# Patient Record
Sex: Female | Born: 1960
Health system: Southern US, Community
[De-identification: ages and names within clinical notes are randomized; demographics above are authoritative.]

## PROBLEM LIST (undated history)

## (undated) DIAGNOSIS — I10 Essential (primary) hypertension: Secondary | ICD-10-CM

## (undated) DIAGNOSIS — I7300 Raynaud's syndrome without gangrene: Secondary | ICD-10-CM

## (undated) DIAGNOSIS — M359 Systemic involvement of connective tissue, unspecified: Secondary | ICD-10-CM

## (undated) HISTORY — PX: CARDIAC ELECTROPHYSIOLOGY MAPPING AND ABLATION: SHX1292

## (undated) HISTORY — DX: Raynaud's syndrome without gangrene: I73.00

## (undated) HISTORY — DX: Essential (primary) hypertension: I10

## (undated) HISTORY — DX: Systemic involvement of connective tissue, unspecified: M35.9

---

## 2005-06-12 HISTORY — PX: HERNIA REPAIR: SHX51

## 2005-06-23 ENCOUNTER — Ambulatory Visit: Payer: Self-pay | Admitting: Family Medicine

## 2005-07-14 ENCOUNTER — Ambulatory Visit: Payer: Self-pay | Admitting: Family Medicine

## 2007-12-24 ENCOUNTER — Encounter: Payer: Self-pay | Admitting: Family Medicine

## 2008-12-31 ENCOUNTER — Encounter: Payer: Self-pay | Admitting: Family Medicine

## 2009-03-24 ENCOUNTER — Ambulatory Visit: Payer: Self-pay | Admitting: Diagnostic Radiology

## 2009-03-24 ENCOUNTER — Emergency Department (HOSPITAL_BASED_OUTPATIENT_CLINIC_OR_DEPARTMENT_OTHER): Admission: EM | Admit: 2009-03-24 | Discharge: 2009-03-24 | Payer: Self-pay | Admitting: Emergency Medicine

## 2009-03-24 ENCOUNTER — Ambulatory Visit: Payer: Self-pay | Admitting: Family Medicine

## 2009-05-12 ENCOUNTER — Encounter: Payer: Self-pay | Admitting: Family Medicine

## 2010-02-04 ENCOUNTER — Encounter: Payer: Self-pay | Admitting: Family Medicine

## 2010-07-12 NOTE — Letter (Signed)
Summary: [facility]  [facility]   Imported By: Lanelle Bal 07/01/2009 11:48:50  _____________________________________________________________________  External Attachment:    Type:   Image     Comment:   External Document

## 2010-07-12 NOTE — Letter (Signed)
Summary: [facility]   Imported By: Lanelle Bal 02/17/2010 08:28:59  _____________________________________________________________________  External Attachment:    Type:   Image     Comment:   External Document

## 2010-09-15 LAB — CBC
MCV: 91.6 fL (ref 78.0–100.0)
RBC: 4.44 MIL/uL (ref 3.87–5.11)

## 2010-09-15 LAB — POCT CARDIAC MARKERS
CKMB, poc: <1 ng/mL — ABNORMAL LOW (ref 1.0–8.0)
Myoglobin, poc: 78.6 ng/mL (ref 12–200)
Troponin i, poc: <0.05 ng/mL (ref 0.00–0.09)

## 2010-09-15 LAB — DIFFERENTIAL
Basophils Absolute: 0.1 10*3/uL (ref 0.0–0.1)
Basophils Relative: 2 % — ABNORMAL HIGH (ref 0–1)
Eosinophils Absolute: 0 10*3/uL (ref 0.0–0.7)
Neutrophils Relative %: 89 % — ABNORMAL HIGH (ref 43–77)

## 2010-09-15 LAB — BASIC METABOLIC PANEL
BUN: 11 mg/dL (ref 6–23)
CO2: 21 mEq/L (ref 19–32)
Calcium: 9.6 mg/dL (ref 8.4–10.5)
Chloride: 102 mEq/L (ref 96–112)
GFR calc Af Amer: 59 mL/min — ABNORMAL LOW (ref >60)
GFR calc non Af Amer: 48 mL/min — ABNORMAL LOW (ref >60)
Glucose, Bld: 166 mg/dL — ABNORMAL HIGH (ref 70–99)
Potassium: 4.4 mEq/L (ref 3.5–5.1)
Sodium: 140 mEq/L (ref 135–145)

## 2010-12-04 ENCOUNTER — Encounter: Payer: Self-pay | Admitting: Family Medicine

## 2010-12-09 ENCOUNTER — Other Ambulatory Visit (HOSPITAL_COMMUNITY)
Admission: RE | Admit: 2010-12-09 | Discharge: 2010-12-09 | Disposition: A | Discharge status: Home / Self Care | Payer: BLUE CROSS/BLUE SHIELD | Source: Ambulatory Visit | Attending: Family Medicine | Admitting: Family Medicine

## 2010-12-09 ENCOUNTER — Encounter: Payer: Self-pay | Admitting: Family Medicine

## 2010-12-09 ENCOUNTER — Ambulatory Visit (INDEPENDENT_AMBULATORY_CARE_PROVIDER_SITE_OTHER): Payer: BLUE CROSS/BLUE SHIELD | Admitting: Family Medicine

## 2010-12-09 DIAGNOSIS — Z01419 Encounter for gynecological examination (general) (routine) without abnormal findings: Secondary | ICD-10-CM | POA: Insufficient documentation

## 2010-12-09 DIAGNOSIS — Z13220 Encounter for screening for lipoid disorders: Secondary | ICD-10-CM

## 2010-12-09 DIAGNOSIS — Z1231 Encounter for screening mammogram for malignant neoplasm of breast: Secondary | ICD-10-CM

## 2010-12-09 DIAGNOSIS — Z1329 Encounter for screening for other suspected endocrine disorder: Secondary | ICD-10-CM

## 2010-12-09 DIAGNOSIS — Z130 Encounter for screening for diseases of the blood and blood-forming organs and certain disorders involving the immune mechanism: Secondary | ICD-10-CM

## 2010-12-09 MED ORDER — NORELGESTROMIN-ETH ESTRADIOL 150-35 MCG/24HR TD PTWK: 1.0000 | MEDICATED_PATCH | TRANSDERMAL | Status: DC

## 2010-12-09 NOTE — Progress Notes (Signed)
  Subjective:    Patient ID: Mariah Mckinney, female    DOB: 10-27-1960, 50 y.o.   MRN: [id number]  HPI  50 yo AAF presents for CPE.  She is having regular periods on the ortho evra.  She is seeing Dr Huel Coventry at [facility] for her connective tissue dz and alopecia, doing well on plaquenil.  She sees optho on a regular basis.  She is married, monagamous, due for fasting labs.  She had Tdap updated in 2005.  Denies fam hx of colon or breast cancer.  Mammogram is due.  Pap is due.  Denies fam hx of premature heart dz.  BP 126/83  Pulse 80  Temp(Src) 98.3 F (36.8 C) (Oral)  Ht 5' 6.5" (1.689 m)  Wt 137 lb (62.143 kg)  BMI 21.78 kg/m2  SpO2 100%  LMP 11/28/2010   Review of SystemsGen: no fevers, chills, hot flashes, night sweats, change in weight GI: no N/V/C/D GU: no dysuria, incontinence or sexual dysfunction CV: no chest pain, DOE, palpitations s or edema Pulm:  Denies CP, SOB or chronic cough      Objective:   Physical ExamGen: alert, well groomed in NAD Neck: no thyromegaly or cervical lymphadenopathy CV: RRR w/o murmur, no audible carotid bruits or abdominal aortic bruits Ext: no edema, clubbing or cyanosis Lungs: CTA bilat w/o W/R/R; nonlabored HEENT:  [place]/AT; PERRLA; oropharynx pink and moist with good dentition Abd: soft, NT, ND, NABS, No HSM, no audible AA bruits Skin: warm and dry; no rash, pallor or jaundice Psych: does not appear anxious or depressed; answers questions appropriately Breasts: no palpable masses, nipple discharge or axillary lymphadenopathy GU normal external exam.  nromal appearing cervical os.  Thin prep pap obtained.  Normal bimanual exam.        Assessment & Plan:  Assesment:  1. CPE- Keeping healthy checklist for women reviewed today.  BP at goal.  BMI 21  in the normal range.     Labs ordered Colonoscopy due at 52 Contraception-- RFd Ortho Evra but will ask Dr Vassie Moselle if she's been tested for APA syndrome which would put her higher risk of blood  clots with her connective tissue d/o. Last pap- updated today Mammogram- scheduled. Immunizaitons UTD. Encouraged healthy diet, regular exercise, MVI daily. Return for next physical in 1 yr.

## 2010-12-09 NOTE — Patient Instructions (Signed)
Ortho Evra RFd.  Update fasting labs one morning. Will call you with lab and pap results.  Call [phone] and ask for [place] to set up your mammogram downstairs.  Return as needed or in 1 yr for next physical.

## 2010-12-14 ENCOUNTER — Telehealth: Payer: Self-pay | Admitting: Family Medicine

## 2010-12-14 NOTE — Telephone Encounter (Signed)
Pls let pt know that her pap smear came back normal.  Repeat in 2 yrs. 

## 2010-12-15 NOTE — Telephone Encounter (Signed)
LMOM informing Pt  

## 2010-12-20 ENCOUNTER — Ambulatory Visit
Admission: RE | Admit: 2010-12-20 | Discharge: 2010-12-20 | Disposition: A | Discharge status: Home / Self Care | Payer: BLUE CROSS/BLUE SHIELD | Source: Ambulatory Visit | Attending: Family Medicine | Admitting: Family Medicine

## 2010-12-20 DIAGNOSIS — Z1231 Encounter for screening mammogram for malignant neoplasm of breast: Secondary | ICD-10-CM

## 2010-12-20 LAB — LIPID PANEL
Cholesterol: 175 mg/dL (ref 0–200)
HDL: 61 mg/dL (ref >39)
LDL Cholesterol: 100 mg/dL — ABNORMAL HIGH (ref 0–99)
Total CHOL/HDL Ratio: 2.9 Ratio

## 2010-12-21 ENCOUNTER — Telehealth: Payer: Self-pay | Admitting: Family Medicine

## 2010-12-21 LAB — COMPLETE METABOLIC PANEL WITH GFR
ALT: 10 U/L (ref 0–35)
Albumin: 3.9 g/dL (ref 3.5–5.2)
Alkaline Phosphatase: 58 U/L (ref 39–117)
Calcium: 9.4 mg/dL (ref 8.4–10.5)
Chloride: 101 mEq/L (ref 96–112)
GFR, Est African American: >60 mL/min (ref >60)
Glucose, Bld: 73 mg/dL (ref 70–99)
Total Bilirubin: 0.5 mg/dL (ref 0.3–1.2)

## 2010-12-21 NOTE — Telephone Encounter (Signed)
Please call pt.  Chemistry panel shows a normal fasting sugar, liver and kidney function with excellent cholesterol results.  Repeat in 1-2 yrs.    

## 2010-12-21 NOTE — Telephone Encounter (Signed)
Pt aware of the above  

## 2011-01-31 ENCOUNTER — Ambulatory Visit (INDEPENDENT_AMBULATORY_CARE_PROVIDER_SITE_OTHER): Payer: BLUE CROSS/BLUE SHIELD | Admitting: Family Medicine

## 2011-01-31 ENCOUNTER — Encounter: Payer: Self-pay | Admitting: Family Medicine

## 2011-01-31 DIAGNOSIS — R42 Dizziness and giddiness: Secondary | ICD-10-CM

## 2011-01-31 DIAGNOSIS — R079 Chest pain, unspecified: Secondary | ICD-10-CM

## 2011-01-31 LAB — COMPLETE METABOLIC PANEL WITH GFR
ALT: 16 U/L (ref 0–35)
Albumin: 4.7 g/dL (ref 3.5–5.2)
Alkaline Phosphatase: 70 U/L (ref 39–117)
CO2: 24 mEq/L (ref 19–32)
Creat: 0.9 mg/dL (ref 0.50–1.10)
GFR, Est African American: >60 mL/min (ref >60)
GFR, Est Non African American: >60 mL/min (ref >60)
Total Bilirubin: 0.5 mg/dL (ref 0.3–1.2)
Total Protein: 8 g/dL (ref 6.0–8.3)

## 2011-01-31 LAB — POCT URINALYSIS DIPSTICK
Bilirubin, UA: NEGATIVE
Glucose, UA: NEGATIVE
Nitrite, UA: NEGATIVE
Protein, UA: NEGATIVE
pH, UA: 6

## 2011-01-31 LAB — CBC WITH DIFFERENTIAL/PLATELET
Basophils Relative: 1 % (ref 0–1)
Lymphocytes Relative: 28 % (ref 12–46)
Lymphs Abs: 0.8 10*3/uL (ref 0.7–4.0)
MCH: 29.4 pg (ref 26.0–34.0)
MCHC: 33.9 g/dL (ref 30.0–36.0)
Monocytes Absolute: 0.2 10*3/uL (ref 0.1–1.0)
Monocytes Relative: 8 % (ref 3–12)
Neutrophils Relative %: 64 % (ref 43–77)
Platelets: 218 10*3/uL (ref 150–400)
RDW: 13 % (ref 11.5–15.5)
WBC: 3.1 10*3/uL — ABNORMAL LOW (ref 4.0–10.5)

## 2011-01-31 LAB — IRON AND TIBC
%SAT: 32 % (ref 20–55)
UIBC: 241 ug/dL

## 2011-01-31 LAB — CK TOTAL AND CKMB (NOT AT ARMC): CK, MB: 0.5 ng/mL (ref 0.3–4.0)

## 2011-01-31 NOTE — Patient Instructions (Signed)
Labs today.  Will call you w/ results by tomorrow.  Take it easy tonight.  If any chest pain, shortness of breathe, worsening symptoms, please go to the emergency room.

## 2011-01-31 NOTE — Progress Notes (Signed)
  Subjective:    Patient ID: Mariah Mckinney, female    DOB: Jan 24, 1961, 50 y.o.   MRN: [id number]  HPI  50 yo AAF with hx of connective tissue d/o presents for about 2 wks of varying symptoms.  She had a nosebleed 2 wks ago followed by feeling lightheadedness, HAs.  She had her period last wk and it was not heavy.  She has had numbness in the L shoulder and L arm on and off for about 2 wks.  She saw her rheumatologist and had an injection in her L shoulder but it only helped for a few days.  She feels hot all over but is not sweaty.  She had had a few waves of nausea and fatigue.  She has some episodes of rapid heartbeat with SOB.  No acute stressors.  No chest pain but she's had some chest tightness.    Has not had any syncope or presyncope.  Eating and drinking normally.  She does not have any known fam hx of heart dz.  She's had some heart dz in her 2nd cousins at a young age.    She sees Dr Isabell Jarvis for cardiology care but it has been about a year.  Her only cardiac hx is significant for an ablation.  She is not a smoker.  She has HTN but no hx of high cholesterol.  BP 150/90  Pulse 71  Temp(Src) 98.3 F (36.8 C) (Oral)  Ht 5' 6.5" (1.689 m)  Wt 137 lb (62.143 kg)  BMI 21.78 kg/m2  SpO2 97%  LMP 01/24/2011    Review of Systems  Constitutional: Positive for fatigue. Negative for fever and unexpected weight change.  Respiratory: Positive for chest tightness and shortness of breath.   Cardiovascular: Positive for chest pain and palpitations. Negative for leg swelling.  Gastrointestinal: Positive for nausea. Negative for abdominal pain and diarrhea.  Neurological: Positive for weakness and light-headedness. Negative for dizziness.  Psychiatric/Behavioral: Negative for dysphoric mood.       Objective:   Physical Exam  Constitutional: She appears well-developed and well-nourished.  HENT:  Mouth/Throat: Oropharynx is clear and moist.  Neck: Neck supple. No thyromegaly present.    Cardiovascular: Normal rate, regular rhythm and normal heart sounds.  Exam reveals no gallop and no friction rub.   No murmur heard. Pulmonary/Chest: Effort normal and breath sounds normal. No respiratory distress. She has no wheezes.       No splinting or reproducible chest wall tenderness to palpation  Abdominal: Soft. Bowel sounds are normal. There is no tenderness.  Musculoskeletal: She exhibits no edema.  Lymphadenopathy:    She has no cervical adenopathy.  Neurological:       No tremor.  Gait normal  Skin: Skin is warm and dry.  Psychiatric: She has a normal mood and affect.          Assessment & Plan:  Chest pain with lightheadness, nausea and sweating x 2 wks   EKG today shows NSR at 67 bpm with PR of  162 msec, QTc of 405 msec, normal axis.  TWI in leads AVF and II, III, V3, V1. She is hemodynamically stable and I do not have an old one to compare to.  Faxed today's to Dr Isabell Jarvis to review. Will get cardiac enzymes, CBC, D dimer, etc today and f/u results tonight.  If worsening of symptoms, go to the ED immediately.  Pt understands plan.

## 2011-02-01 ENCOUNTER — Telehealth: Payer: Self-pay | Admitting: Family Medicine

## 2011-02-01 NOTE — Telephone Encounter (Signed)
Pt's husband called and said he had just gotten a call from Payton Spark, CMA about his wife, but he wanted to let us know that she is at the ED at [facility] getting checked out because she was having CP and SOB this am. Plan:  Louis Meckel, CMA and she said she was trying to get a hold of the pt because they were going to send to the lab for a D-Dimer test to r/o clot.  Told the pt so he could suggest to the ED to make sure they run that test while they are working up the pt. Jarvis Newcomer, LPN [facility]

## 2011-02-02 ENCOUNTER — Telehealth: Payer: Self-pay | Admitting: Family Medicine

## 2011-02-02 NOTE — Telephone Encounter (Signed)
I called and spoke to [place].  Her w/u from the ED was 'normal'.  A CT chest r/o PE and her cardiac testing was neg.  She has f/u with her cardiologist, Dr Isabell Jarvis  In 2 wks and has called her rheumatologist for f/u.  Her labs here was normal other than a WBC of 3.1.  She is still having some chest pains.  I suggest she start Prilosec OTC daily b/c her NSAID may be causing some GERD symptoms.  She agrees and will f/u here after she sees her specialist. Marcelino Duster, pls call [facility] ED to get copy of recent notes.  She does not need to sign a release for this.

## 2011-02-02 NOTE — Telephone Encounter (Signed)
Acknowledged. Timur Nibert, LPN /Triage  

## 2011-02-02 NOTE — Telephone Encounter (Signed)
Pt called to see why we were calling her yesterday.  Told her we spoke with her husband yesterday and told him since she was in ED to have them runa D-Dimer since that is why we were intially calling her.  Pt said the labs checked out to be negative. Plan:  Pt told to schedule an 30 min hospital follow up appt with our office, and sign medical release of records, and to have them fax to our office.  CLinical fax number supplied to the pt.  Pt would like all her lab results from 01-31-11.  I tried to pull the lab results but the labs are not pulling up from my end.  Please advise. Routed to Dr. Marlyne Beards, LPN [facility]

## 2011-02-07 ENCOUNTER — Ambulatory Visit (INDEPENDENT_AMBULATORY_CARE_PROVIDER_SITE_OTHER): Payer: BLUE CROSS/BLUE SHIELD | Admitting: Family Medicine

## 2011-02-07 ENCOUNTER — Encounter: Payer: Self-pay | Admitting: Family Medicine

## 2011-02-07 DIAGNOSIS — R002 Palpitations: Secondary | ICD-10-CM

## 2011-02-07 NOTE — Progress Notes (Signed)
Subjective:    Patient ID: Mariah Mckinney, female    DOB: 18-Jul-1960, 50 y.o.   MRN: [id number]  HPI Chest Pain f/u - Saw Dr. Cathey Endow on 8/21.  The next AM went to the ED.  Rheum says not related to the connective tissues d/o.  Cards feels like it is not her heart. Chest feels like a rapid heart beat. Gets SOB with it. Lasts seconds.  The longest it lasts is a couple of minutes. Usually every day. She feels her heart rate is fast. She feels like it is her SVT.  Takes her metoprolol every day.  Feel lightheaded a lot. Thyroid level was normal at the hosp. D-dimer was normaml at the hosp. Palpitations Started about 1 month ago. Sometimes feels warm all over. After she was discharged from the emergency department she was told to followup with her PCP as well as her cardiologist. She has also been having some pain in her left shoulder which now is radiating up into her left ear and would like me to take a look in her ear. She did recently have an injection in her left shoulder by her rheumatologist.She did get some pain relief initially. She denies nay caffeine intake.   Review of Systems     Objective:   Physical Exam  Constitutional: She is oriented to person, place, and time. She appears well-developed and well-nourished.  HENT:  Head: Normocephalic and atraumatic.  Right Ear: External ear normal.  Left Ear: External ear normal.  Nose: Nose normal.       TMs and canals are clear.   Eyes: Conjunctivae and EOM are normal. Pupils are equal, round, and reactive to light.  Neck: Neck supple. No thyromegaly present.  Cardiovascular: Normal rate, regular rhythm and normal heart sounds.   Pulmonary/Chest: Effort normal and breath sounds normal. She has no wheezes.  Musculoskeletal: She exhibits no edema.  Lymphadenopathy:    She has no cervical adenopathy.  Neurological: She is alert and oriented to person, place, and time.  Skin: Skin is warm and dry.  Psychiatric: She has a normal mood and  affect. Her behavior is normal.          Assessment & Plan:  Palpitations-I discussed with her and her husband that I think this is most likely related to her SVT.  She says herself that she feels that the symptoms are very similar to her SVT just not as intense as what she had before. She did have an ablation in the past which was considered technically unsuccessful. Certainly we could increase her metoprolol to see if that gets her symptoms under better control but she would like to wait as she sees her cardiologist next week. I explained to her that she's had most other things for now. If not a thyroid disorder as her thyroid was normal at the hospital. Her d-dimer was normal so this rules out a pulmonary and malaise. She was ruled out for an acute cardiac event. She had a chest x-ray which per her report was normal which rules out infection. She also had a normal complete blood count was also rules out infection as well as anemia which can cause palpitations. This would be unlikely to be her hormone therapy but we could consider this. Today her blood pressure looks fantastic. She denies feeling stressed or anxious. I did mention that anxiety can be a cause for palpitations and we have ruled out other causes. She also says that her rheumatologist feels that  this is not related to her connective tissue disorder. I explained to her that at this point I think her best that is keeping her appointment with her cardiologist for next week to have them evaluated this could be her SVT again. If the cardiologist feels this is completely unrelated and we may need to consider a diagnosis of anxiety. I don't recommend any additional blood work at this point in time. She denies caffeine or stimulatn intake.

## 2011-02-15 ENCOUNTER — Encounter: Payer: Self-pay | Admitting: Family Medicine

## 2011-11-22 ENCOUNTER — Telehealth: Payer: Self-pay | Admitting: Family Medicine

## 2011-11-22 MED ORDER — NORELGESTROMIN-ETH ESTRADIOL 150-35 MCG/24HR TD PTWK: 1.0000 | MEDICATED_PATCH | TRANSDERMAL | Status: DC

## 2011-11-22 NOTE — Telephone Encounter (Signed)
Patient walk-in made a cpe appt..She use to be a patient of Dr. Misty Stanley.Marland KitchenMarland KitchenMade an appointment for 12/13/11 but is out of Ortho Ever birth control and wouldl ike to have enough sent to [facility] on [address] until her appt on 12/13/11.Marland KitchenMarland Kitchen

## 2011-12-08 ENCOUNTER — Encounter: Payer: Self-pay | Admitting: *Deleted

## 2011-12-13 ENCOUNTER — Encounter: Payer: Self-pay | Admitting: Family Medicine

## 2011-12-13 ENCOUNTER — Ambulatory Visit (INDEPENDENT_AMBULATORY_CARE_PROVIDER_SITE_OTHER): Payer: BC Managed Care – PPO | Admitting: Family Medicine

## 2011-12-13 ENCOUNTER — Other Ambulatory Visit (HOSPITAL_COMMUNITY)
Admission: RE | Admit: 2011-12-13 | Discharge: 2011-12-13 | Disposition: A | Discharge status: Home / Self Care | Payer: BC Managed Care – PPO | Source: Ambulatory Visit | Attending: Family Medicine | Admitting: Family Medicine

## 2011-12-13 VITALS — BP 107/75 | HR 80 | Ht 66.5 in | Wt 131.0 lb

## 2011-12-13 DIAGNOSIS — Z01419 Encounter for gynecological examination (general) (routine) without abnormal findings: Secondary | ICD-10-CM

## 2011-12-13 DIAGNOSIS — N760 Acute vaginitis: Secondary | ICD-10-CM

## 2011-12-13 DIAGNOSIS — Z23 Encounter for immunization: Secondary | ICD-10-CM

## 2011-12-13 DIAGNOSIS — R002 Palpitations: Secondary | ICD-10-CM

## 2011-12-13 DIAGNOSIS — M359 Systemic involvement of connective tissue, unspecified: Secondary | ICD-10-CM

## 2011-12-13 DIAGNOSIS — I471 Supraventricular tachycardia: Secondary | ICD-10-CM

## 2011-12-13 MED ORDER — NORELGESTROMIN-ETH ESTRADIOL 150-35 MCG/24HR TD PTWK: 1.0000 | MEDICATED_PATCH | TRANSDERMAL | Status: DC

## 2011-12-13 NOTE — Progress Notes (Signed)
  Subjective:     Mariah Mckinney is a 51 y.o. female and is here for a comprehensive physical exam. The patient reports no problems. She has noticed a slight increase in discharge. No discomfort or itching or bleeding. No pelvic pain or fever.  History   Social History  . Marital Status: Married    Spouse Name: N/A    Number of Children: 1   . Years of Education: N/A   Occupational History  . Stay at home mom    Social History Main Topics  . Smoking status: Never Smoker   . Smokeless tobacco: Not on file  . Alcohol Use: No  . Drug Use: No  . Sexually Active: Yes -- Female partner(s)     married, 62 yr old son, walks daily.   Other Topics Concern  . Not on file   Social History Narrative   Walking for exercise.    Health Maintenance  Topic Date Due  . Tetanus/tdap  06/26/1981  . Influenza Vaccine  03/12/2012  . Pap Smear  12/13/2014    The following portions of the patient's history were reviewed and updated as appropriate: allergies, current medications, past family history, past medical history, past social history, past surgical history and problem list.  Review of Systems A comprehensive review of systems was negative.   Objective:    BP 107/75  Pulse 80  Ht 5' 6.5" (1.689 m)  Wt 131 lb (59.421 kg)  BMI 20.83 kg/m2  LMP 11/27/2011 General appearance: alert, cooperative and appears stated age Head: Normocephalic, without obvious abnormality, atraumatic Eyes: conj clear, EOMi, PEERLA Ears: normal TM's and external ear canals both ears Nose: Nares normal. Septum midline. Mucosa normal. No drainage or sinus tenderness. Throat: lips, mucosa, and tongue normal; teeth and gums normal Neck: no adenopathy, no carotid bruit, no JVD, supple, symmetrical, trachea midline and thyroid not enlarged, symmetric, no tenderness/mass/nodules Back: symmetric, no curvature. ROM normal. No CVA tenderness. Lungs: clear to auscultation bilaterally Breasts: normal appearance, no masses  or tenderness Heart: regular rate and rhythm, S1, S2 normal, no murmur, click, rub or gallop Abdomen: soft, non-tender; bowel sounds normal; no masses,  no organomegaly Pelvic: cervix normal in appearance, external genitalia normal, no adnexal masses or tenderness, no cervical motion tenderness, rectovaginal septum normal, uterus normal size, shape, and consistency and vagina normal without discharge Extremities: extremities normal, atraumatic, no cyanosis or edema Pulses: 2+ and symmetric Skin: Skin color, texture, turgor normal. No rashes or lesions Lymph nodes: Cervical, supraclavicular, and axillary nodes normal. Neurologic: Grossly normal    Assessment:    Healthy female exam.     Plan:     See After Visit Summary for Counseling Recommendations  Start a regular exercise program and make sure you are eating a healthy diet Try to eat 4 servings of dairy a day or take a calcium supplement (500mg  twice a day). Your vaccines are up to date.  Screening labs.   Tdap given today.   Refill Ortho Evra for one year. If her Pap smear comes back normal repeat in 2-3 years. She's not had a prior abnormal Pap smear.  Increased vaginal discharge-will check a wet prep. On exam it appears to be physiologic.

## 2011-12-13 NOTE — Patient Instructions (Addendum)
Start a regular exercise program and make sure you are eating a healthy diet Try to eat 4 servings of dairy a day  Your vaccines are up to date.   

## 2011-12-14 LAB — WET PREP FOR TRICH, YEAST, CLUE
Trich, Wet Prep: NONE SEEN
WBC, Wet Prep HPF POC: NONE SEEN

## 2011-12-22 ENCOUNTER — Encounter: Payer: Self-pay | Admitting: *Deleted

## 2011-12-28 LAB — COMPLETE METABOLIC PANEL WITH GFR
Albumin: 3.9 g/dL (ref 3.5–5.2)
BUN: 8 mg/dL (ref 6–23)
Calcium: 9.2 mg/dL (ref 8.4–10.5)
Creat: 0.92 mg/dL (ref 0.50–1.10)
Glucose, Bld: 72 mg/dL (ref 70–99)
Potassium: 4.2 mEq/L (ref 3.5–5.3)
Total Protein: 7 g/dL (ref 6.0–8.3)

## 2011-12-28 LAB — LIPID PANEL
Cholesterol: 200 mg/dL (ref 0–200)
HDL: 66 mg/dL (ref >39)
Total CHOL/HDL Ratio: 3 Ratio

## 2012-01-10 DIAGNOSIS — M359 Systemic involvement of connective tissue, unspecified: Secondary | ICD-10-CM | POA: Insufficient documentation

## 2012-04-17 ENCOUNTER — Encounter: Payer: Self-pay | Admitting: Family Medicine

## 2012-04-17 ENCOUNTER — Ambulatory Visit (INDEPENDENT_AMBULATORY_CARE_PROVIDER_SITE_OTHER): Payer: BC Managed Care – PPO | Admitting: Family Medicine

## 2012-04-17 VITALS — BP 124/75 | HR 75 | Ht 66.5 in | Wt 135.0 lb

## 2012-04-17 DIAGNOSIS — N912 Amenorrhea, unspecified: Secondary | ICD-10-CM

## 2012-04-17 DIAGNOSIS — R5383 Other fatigue: Secondary | ICD-10-CM

## 2012-04-17 DIAGNOSIS — R5381 Other malaise: Secondary | ICD-10-CM

## 2012-04-17 NOTE — Progress Notes (Signed)
  Subjective:    Patient ID: Mariah Mckinney, female    DOB: 1961-02-22, 51 y.o.   MRN: [id number]  HPI Period were very irregular up until July and then has not had a period for 4 months.  No breast tenderness. No hotflashes.  Usng the patch for 10 years for birth control. Doesn't have the patch on today. No skin or hair changes thought derm noticed  Some inc hair thinning. No other sxs. NO GI sxs.  Had normal pap smear in July.    Review of Systems     Objective:   Physical Exam  Constitutional: She is oriented to person, place, and time. She appears well-developed and well-nourished.  HENT:  Head: Normocephalic and atraumatic.  Neck: Neck supple. No thyromegaly present.  Cardiovascular: Normal rate, regular rhythm and normal heart sounds.        No thyroid nodule. No carotid bruits.   Pulmonary/Chest: Effort normal and breath sounds normal.  Abdominal: Soft. Bowel sounds are normal. She exhibits no distension and no mass. There is no tenderness. There is no rebound and no guarding.  Musculoskeletal: She exhibits no edema.  Lymphadenopathy:    She has no cervical adenopathy.  Neurological: She is alert and oriented to person, place, and time.  Skin: Skin is warm and dry.  Psychiatric: She has a normal mood and affect. Her behavior is normal.          Assessment & Plan:  Amenorrhea - certainly she could be entering menopause. We will check estradiol, progesterone, FSH, LH. We will also check thyroid to rule out thyroid abnormality. Also check a CBC. She's currently on birth control but she is sexually active so we will also check for a serum pregnancy. We'll call with results as soon as they're back. She is not currently expensing any postmenopausal symptoms.

## 2012-04-17 NOTE — Patient Instructions (Addendum)
We will call you with your lab results. If you don't here from us in about a week then please give us a call at [phone].  

## 2012-04-18 LAB — CBC WITH DIFFERENTIAL/PLATELET
Basophils Absolute: 0 10*3/uL (ref 0.0–0.1)
Basophils Relative: 1 % (ref 0–1)
Eosinophils Relative: 0 % (ref 0–5)
Hemoglobin: 12.5 g/dL (ref 12.0–15.0)
Lymphs Abs: 0.7 10*3/uL (ref 0.7–4.0)
MCH: 29.1 pg (ref 26.0–34.0)
MCV: 86.7 fL (ref 78.0–100.0)
Monocytes Relative: 8 % (ref 3–12)
Neutro Abs: 2.3 10*3/uL (ref 1.7–7.7)
Neutrophils Relative %: 70 % (ref 43–77)
RBC: 4.3 MIL/uL (ref 3.87–5.11)
WBC: 3.4 10*3/uL — ABNORMAL LOW (ref 4.0–10.5)

## 2012-04-18 LAB — PROGESTERONE: Progesterone: <0.2 ng/mL

## 2012-04-18 LAB — FOLLICLE STIMULATING HORMONE: FSH: 42 m[IU]/mL

## 2012-11-17 ENCOUNTER — Other Ambulatory Visit: Payer: Self-pay | Admitting: Family Medicine

## 2012-12-09 ENCOUNTER — Telehealth: Payer: Self-pay | Admitting: *Deleted

## 2012-12-09 NOTE — Telephone Encounter (Signed)
Pt is asking for 90 supply of her birth control patch.  I see that we just sent in some with 2 refills.  Are you ok with me changing it so soon? Please advise

## 2012-12-09 NOTE — Telephone Encounter (Signed)
Okay to change to 90 day supply 

## 2012-12-10 ENCOUNTER — Other Ambulatory Visit: Payer: Self-pay | Admitting: *Deleted

## 2012-12-10 MED ORDER — NORELGESTROMIN-ETH ESTRADIOL 150-35 MCG/24HR TD PTWK: MEDICATED_PATCH | TRANSDERMAL | Status: DC

## 2012-12-10 NOTE — Telephone Encounter (Signed)
rx sent for 90 days & pt notified.

## 2013-08-13 ENCOUNTER — Other Ambulatory Visit: Payer: Self-pay | Admitting: Family Medicine

## 2013-11-10 ENCOUNTER — Other Ambulatory Visit: Payer: Self-pay | Admitting: *Deleted

## 2013-11-10 MED ORDER — NORELGESTROMIN-ETH ESTRADIOL 150-35 MCG/24HR TD PTWK: MEDICATED_PATCH | TRANSDERMAL | Status: DC

## 2013-11-24 ENCOUNTER — Encounter: Payer: BC Managed Care – PPO | Admitting: Family Medicine

## 2013-12-02 ENCOUNTER — Ambulatory Visit (INDEPENDENT_AMBULATORY_CARE_PROVIDER_SITE_OTHER): Payer: BC Managed Care – PPO | Admitting: Family Medicine

## 2013-12-02 ENCOUNTER — Encounter: Payer: Self-pay | Admitting: Family Medicine

## 2013-12-02 VITALS — BP 124/72 | HR 72 | Ht 66.5 in | Wt 132.0 lb

## 2013-12-02 DIAGNOSIS — Z0000 Encounter for general adult medical examination without abnormal findings: Secondary | ICD-10-CM

## 2013-12-02 DIAGNOSIS — Z1211 Encounter for screening for malignant neoplasm of colon: Secondary | ICD-10-CM

## 2013-12-02 DIAGNOSIS — Z1231 Encounter for screening mammogram for malignant neoplasm of breast: Secondary | ICD-10-CM

## 2013-12-02 LAB — COMPLETE METABOLIC PANEL WITH GFR
ALBUMIN: 4.1 g/dL (ref 3.5–5.2)
ALK PHOS: 59 U/L (ref 39–117)
ALT: 11 U/L (ref 0–35)
AST: 20 U/L (ref 0–37)
BUN: 10 mg/dL (ref 6–23)
CALCIUM: 9.3 mg/dL (ref 8.4–10.5)
CHLORIDE: 101 meq/L (ref 96–112)
CO2: 25 mEq/L (ref 19–32)
Creat: 0.89 mg/dL (ref 0.50–1.10)
GFR, EST NON AFRICAN AMERICAN: 75 mL/min
GFR, Est African American: 87 mL/min
GLUCOSE: 67 mg/dL — AB (ref 70–99)
POTASSIUM: 4.3 meq/L (ref 3.5–5.3)
SODIUM: 135 meq/L (ref 135–145)
TOTAL PROTEIN: 7.3 g/dL (ref 6.0–8.3)
Total Bilirubin: 0.5 mg/dL (ref 0.2–1.2)

## 2013-12-02 LAB — LIPID PANEL
CHOLESTEROL: 220 mg/dL — AB (ref 0–200)
HDL: 72 mg/dL (ref >39)
LDL Cholesterol: 135 mg/dL — ABNORMAL HIGH (ref 0–99)
Total CHOL/HDL Ratio: 3.1 Ratio
Triglycerides: 63 mg/dL (ref <150)
VLDL: 13 mg/dL (ref 0–40)

## 2013-12-02 MED ORDER — NORELGESTROMIN-ETH ESTRADIOL 150-35 MCG/24HR TD PTWK: MEDICATED_PATCH | TRANSDERMAL | Status: DC

## 2013-12-02 NOTE — Patient Instructions (Signed)
Keep up a regular exercise program and make sure you are eating a healthy diet Try to eat 4 servings of dairy a day, or if you are lactose intolerant take a calcium with vitamin D daily.  Your vaccines are up to date.   

## 2013-12-02 NOTE — Progress Notes (Signed)
  Subjective:     Mariah Mckinney is a 53 y.o. female and is here for a comprehensive physical exam. The patient reports no problems.  History   Social History  . Marital Status: Married    Spouse Name: N/A    Number of Children: 1   . Years of Education: N/A   Occupational History  . Stay at home mom    Social History Main Topics  . Smoking status: Never Smoker   . Smokeless tobacco: Not on file  . Alcohol Use: No  . Drug Use: No  . Sexual Activity: Yes    Partners: Male     Comment: married, 38 yr old son, walks daily.   Other Topics Concern  . Not on file   Social History Narrative   Walking for exercise.    Health Maintenance  Topic Date Due  . Mammogram  06/26/2012  . Colonoscopy  06/26/2012  . Influenza Vaccine  01/10/2014  . Pap Smear  12/13/2014  . Tetanus/tdap  12/12/2021    The following portions of the patient's history were reviewed and updated as appropriate: allergies, current medications, past family history, past medical history, past social history, past surgical history and problem list.  Review of Systems A comprehensive review of systems was negative.   Objective:    BP 124/72  Pulse 72  Ht 5' 6.5" (1.689 m)  Wt 132 lb (59.875 kg)  BMI 20.99 kg/m2 General appearance: alert, cooperative and appears stated age Head: Normocephalic, without obvious abnormality, atraumatic Eyes: conj clear, EOMi, PEERLA Ears: normal TM's and external ear canals both ears Nose: Nares normal. Septum midline. Mucosa normal. No drainage or sinus tenderness. Throat: lips, mucosa, and tongue normal; teeth and gums normal Neck: no adenopathy, no carotid bruit, no JVD, supple, symmetrical, trachea midline and thyroid not enlarged, symmetric, no tenderness/mass/nodules Back: symmetric, no curvature. ROM normal. No CVA tenderness. Lungs: clear to auscultation bilaterally Breasts: normal appearance, no masses or tenderness Heart: regular rate and rhythm, S1, S2  normal, no murmur, click, rub or gallop Abdomen: soft, non-tender; bowel sounds normal; no masses,  no organomegaly Extremities: extremities normal, atraumatic, no cyanosis or edema Pulses: 2+ and symmetric Skin: Skin color, texture, turgor normal. No rashes or lesions Lymph nodes: Cervical, supraclavicular, and axillary nodes normal. Neurologic: Alert and oriented X 3, normal strength and tone. Normal symmetric reflexes. Normal coordination and gait    Assessment:    Healthy female exam.     Plan:     See After Visit Summary for Counseling Recommendations  Keep up a regular exercise program and make sure you are eating a healthy diet Try to eat 4 servings of dairy a day, or if you are lactose intolerant take a calcium with vitamin D daily.  Your vaccines are up to date.   Due for mammo  Discussed need for screening colonoscopy   Given info on shingles vaccine to check and see if insurance covers it.    RF bith control.

## 2014-01-08 LAB — HM COLONOSCOPY: HM Colonoscopy: NORMAL

## 2014-01-15 ENCOUNTER — Ambulatory Visit (INDEPENDENT_AMBULATORY_CARE_PROVIDER_SITE_OTHER): Payer: BC Managed Care – PPO

## 2014-01-15 DIAGNOSIS — Z1231 Encounter for screening mammogram for malignant neoplasm of breast: Secondary | ICD-10-CM

## 2014-01-19 DIAGNOSIS — Z79899 Other long term (current) drug therapy: Secondary | ICD-10-CM | POA: Insufficient documentation

## 2014-01-29 DIAGNOSIS — M65979 Unspecified synovitis and tenosynovitis, unspecified ankle and foot: Secondary | ICD-10-CM | POA: Insufficient documentation

## 2014-01-29 DIAGNOSIS — M659 Synovitis and tenosynovitis, unspecified: Secondary | ICD-10-CM | POA: Insufficient documentation

## 2014-03-26 DIAGNOSIS — L669 Cicatricial alopecia, unspecified: Secondary | ICD-10-CM | POA: Insufficient documentation

## 2014-03-26 DIAGNOSIS — L658 Other specified nonscarring hair loss: Secondary | ICD-10-CM | POA: Insufficient documentation

## 2014-09-03 LAB — SEDIMENTATION RATE: SED RATE: 4

## 2014-09-03 LAB — CBC AND DIFFERENTIAL
HEMOGLOBIN: 11.7 g/dL — AB (ref 12.0–16.0)
PLATELETS: 246 10*3/uL (ref 150–399)
WBC: 4.3 10^3/mL

## 2014-09-03 LAB — HEPATIC FUNCTION PANEL
ALT: 10 U/L (ref 7–35)
AST: 15 U/L (ref 13–35)
Alkaline Phosphatase: 52 U/L (ref 25–125)

## 2014-09-03 LAB — BASIC METABOLIC PANEL: Creatinine: 0.7 mg/dL (ref 0.5–1.1)

## 2014-11-25 ENCOUNTER — Other Ambulatory Visit: Payer: Self-pay | Admitting: Family Medicine

## 2014-11-27 ENCOUNTER — Other Ambulatory Visit: Payer: Self-pay | Admitting: *Deleted

## 2014-11-27 MED ORDER — NORELGESTROMIN-ETH ESTRADIOL 150-35 MCG/24HR TD PTWK: MEDICATED_PATCH | TRANSDERMAL | Status: DC

## 2014-12-24 ENCOUNTER — Ambulatory Visit: Payer: Self-pay | Admitting: Family Medicine

## 2015-01-13 ENCOUNTER — Other Ambulatory Visit (HOSPITAL_COMMUNITY)
Admission: RE | Admit: 2015-01-13 | Discharge: 2015-01-13 | Disposition: A | Discharge status: Home / Self Care | Payer: BLUE CROSS/BLUE SHIELD | Source: Ambulatory Visit | Attending: Family Medicine | Admitting: Family Medicine

## 2015-01-13 ENCOUNTER — Ambulatory Visit (INDEPENDENT_AMBULATORY_CARE_PROVIDER_SITE_OTHER): Payer: BLUE CROSS/BLUE SHIELD | Admitting: Family Medicine

## 2015-01-13 ENCOUNTER — Encounter: Payer: Self-pay | Admitting: Family Medicine

## 2015-01-13 VITALS — BP 137/79 | HR 69 | Ht 66.5 in | Wt 136.0 lb

## 2015-01-13 DIAGNOSIS — Z01419 Encounter for gynecological examination (general) (routine) without abnormal findings: Secondary | ICD-10-CM | POA: Diagnosis not present

## 2015-01-13 DIAGNOSIS — Z0189 Encounter for other specified special examinations: Secondary | ICD-10-CM | POA: Diagnosis not present

## 2015-01-13 DIAGNOSIS — Z13220 Encounter for screening for lipoid disorders: Secondary | ICD-10-CM | POA: Diagnosis not present

## 2015-01-13 DIAGNOSIS — Z1159 Encounter for screening for other viral diseases: Secondary | ICD-10-CM

## 2015-01-13 DIAGNOSIS — Z124 Encounter for screening for malignant neoplasm of cervix: Secondary | ICD-10-CM

## 2015-01-13 DIAGNOSIS — Z114 Encounter for screening for human immunodeficiency virus [HIV]: Secondary | ICD-10-CM

## 2015-01-13 DIAGNOSIS — Z1151 Encounter for screening for human papillomavirus (HPV): Secondary | ICD-10-CM | POA: Insufficient documentation

## 2015-01-13 DIAGNOSIS — Z0000 Encounter for general adult medical examination without abnormal findings: Secondary | ICD-10-CM

## 2015-01-13 LAB — LIPID PANEL
CHOL/HDL RATIO: 2 ratio (ref <=5.0)
Cholesterol: 142 mg/dL (ref 125–200)
HDL: 70 mg/dL (ref >=46)
LDL Cholesterol: 59 mg/dL (ref <130)
Triglycerides: 64 mg/dL (ref <150)
VLDL: 13 mg/dL (ref <30)

## 2015-01-13 LAB — HEPATITIS C ANTIBODY: HCV AB: NEGATIVE

## 2015-01-13 LAB — HIV ANTIBODY (ROUTINE TESTING W REFLEX): HIV 1&2 Ab, 4th Generation: NONREACTIVE

## 2015-01-13 NOTE — Patient Instructions (Signed)
Keep up a regular exercise program and make sure you are eating a healthy diet Try to eat 4 servings of dairy a day, or if you are lactose intolerant take a calcium with vitamin D daily.  Your vaccines are up to date.   

## 2015-01-13 NOTE — Progress Notes (Signed)
  Subjective:     Mariah Mckinney is a 54 y.o. female and is here for a comprehensive physical exam. The patient reports problems - she was in the hospital for CP recently but is follwoing wiht her cardiology. Says she wants to be seen for a CPE today. She goes for yearly eye exam for her Plaquenil.   History   Social History  . Marital Status: Married    Spouse Name: N/A  . Number of Children: 1   . Years of Education: N/A   Occupational History  . Stay at home mom    Social History Main Topics  . Smoking status: Never Smoker   . Smokeless tobacco: Not on file  . Alcohol Use: No  . Drug Use: No  . Sexual Activity:    Partners: Male     Comment: married, 68 yr old son, walks daily.   Other Topics Concern  . Not on file   Social History Narrative   Walking for exercise.    Health Maintenance  Topic Date Due  . Hepatitis C Screening  [birth year]  . HIV Screening  06/26/1977  . PAP SMEAR  12/13/2014  . INFLUENZA VACCINE  01/11/2015  . MAMMOGRAM  01/16/2016  . TETANUS/TDAP  12/12/2021  . COLONOSCOPY  01/09/2024    The following portions of the patient's history were reviewed and updated as appropriate: allergies, current medications, past family history, past medical history, past social history, past surgical history and problem list.  Review of Systems A comprehensive review of systems was negative.   Objective:    BP 137/79 mmHg  Pulse 69  Ht 5' 6.5" (1.689 m)  Wt 136 lb (61.689 kg)  BMI 21.62 kg/m2  LMP 12/30/2014 General appearance: alert, cooperative and appears stated age Head: Normocephalic, without obvious abnormality, atraumatic Eyes: conj clear, EOMI, PEERLA Ears: normal TM's and external ear canals both ears Nose: Nares normal. Septum midline. Mucosa normal. No drainage or sinus tenderness. Throat: lips, mucosa, and tongue normal; teeth and gums normal Neck: no adenopathy, no carotid bruit, no JVD, supple, symmetrical, trachea midline and thyroid  not enlarged, symmetric, no tenderness/mass/nodules Back: symmetric, no curvature. ROM normal. No CVA tenderness. Lungs: clear to auscultation bilaterally Breasts: normal appearance, no masses or tenderness Heart: regular rate and rhythm, S1, S2 normal, no murmur, click, rub or gallop Abdomen: soft, non-tender; bowel sounds normal; no masses,  no organomegaly Pelvic: cervix normal in appearance, external genitalia normal, no adnexal masses or tenderness, no cervical motion tenderness, rectovaginal septum normal, uterus normal size, shape, and consistency and vagina normal without discharge Extremities: extremities normal, atraumatic, no cyanosis or edema Pulses: 2+ and symmetric Skin: Skin color, texture, turgor normal. No rashes or lesions Lymph nodes: Cervical, supraclavicular, and axillary nodes normal. Neurologic: Alert and oriented X 3, normal strength and tone. Normal symmetric reflexes. Normal coordination and gait    Assessment:    Healthy female exam.     Plan:     See After Visit Summary for Counseling Recommendations  Keep up a regular exercise program and make sure you are eating a healthy diet Try to eat 4 servings of dairy a day, or if you are lactose intolerant take a calcium with vitamin D daily.  Your vaccines are up to date.  Will call with pap smear results when available.

## 2015-01-14 LAB — CYTOLOGY - PAP

## 2015-02-16 ENCOUNTER — Other Ambulatory Visit: Payer: Self-pay | Admitting: Family Medicine

## 2015-03-24 ENCOUNTER — Encounter: Payer: Self-pay | Admitting: Family Medicine

## 2015-04-23 ENCOUNTER — Other Ambulatory Visit: Payer: Self-pay | Admitting: Family Medicine

## 2015-11-26 ENCOUNTER — Ambulatory Visit (HOSPITAL_BASED_OUTPATIENT_CLINIC_OR_DEPARTMENT_OTHER)
Admission: RE | Admit: 2015-11-26 | Discharge: 2015-11-26 | Disposition: A | Discharge status: Home / Self Care | Payer: BLUE CROSS/BLUE SHIELD | Source: Ambulatory Visit | Attending: Physician Assistant | Admitting: Physician Assistant

## 2015-11-26 ENCOUNTER — Ambulatory Visit (INDEPENDENT_AMBULATORY_CARE_PROVIDER_SITE_OTHER): Payer: BLUE CROSS/BLUE SHIELD | Admitting: Physician Assistant

## 2015-11-26 ENCOUNTER — Encounter: Payer: Self-pay | Admitting: Physician Assistant

## 2015-11-26 VITALS — BP 159/76 | HR 67 | Ht 65.5 in | Wt 134.0 lb

## 2015-11-26 DIAGNOSIS — R1032 Left lower quadrant pain: Secondary | ICD-10-CM

## 2015-11-26 DIAGNOSIS — R809 Proteinuria, unspecified: Secondary | ICD-10-CM | POA: Diagnosis not present

## 2015-11-26 DIAGNOSIS — D259 Leiomyoma of uterus, unspecified: Secondary | ICD-10-CM | POA: Diagnosis not present

## 2015-11-26 LAB — POCT URINALYSIS DIPSTICK
Bilirubin, UA: NEGATIVE
Blood, UA: NEGATIVE
Glucose, UA: NEGATIVE
KETONES UA: NEGATIVE
LEUKOCYTES UA: NEGATIVE
Nitrite, UA: NEGATIVE
PH UA: 6
Spec Grav, UA: >=1.03
Urobilinogen, UA: 0.2

## 2015-11-26 NOTE — Progress Notes (Signed)
   Subjective:    Patient ID: Mariah Mckinney, female    DOB: 04-27-1961, 55 y.o.   MRN: [id number]  HPI  Patient is a 55 year old female who presents to the clinic with left lower quadrant pain that started a few days ago. She describes the pain as sharp and sudden. The pain comes and immediately goes away. She has approximately had this happened 3 times. She denies any increase in urinary frequency, dysuria, flank pain, fever, chills, nausea or vomiting. Her sister does have a history of ovarian cyst. She has never had any ovarian cysts. She denies any stool changes. She reports to have normal bowel movements. She denies any melena or hematochezia.    Review of Systems  All other systems reviewed and are negative.      Objective:   Physical Exam  Constitutional: She is oriented to person, place, and time. She appears well-developed and well-nourished.  HENT:  Head: Normocephalic and atraumatic.  Cardiovascular: Normal rate, regular rhythm and normal heart sounds.   Pulmonary/Chest: Effort normal and breath sounds normal.  No CVA tenderness.   Abdominal: Soft. Bowel sounds are normal. She exhibits no distension and no mass. There is tenderness. There is no rebound and no guarding.  Mild tenderness over LLQ and suprapubic area. No rebound or guarding.   Neurological: She is alert and oriented to person, place, and time.  Psychiatric: She has a normal mood and affect. Her behavior is normal.          Assessment & Plan:  Left lower quadrant pain-unclear etiology today. I have a suspicion it could be an ovarian cyst... Results for orders placed or performed in visit on 11/26/15  POCT urinalysis dipstick  Result Value Ref Range   Color, UA dark yellow    Clarity, UA slightly cloudy    Glucose, UA neg    Bilirubin, UA neg    Ketones, UA neg    Spec Grav, UA >=1.030    Blood, UA neg    pH, UA 6.0    Protein, UA trace    Urobilinogen, UA 0.2    Nitrite, UA neg    Leukocytes,  UA Negative Negative   Urinalysis revealed negative blood, leukocytes, nitrates. She did have trace protein. We'll culture her urine today. Went ahead and ordered a pelvic and transvaginal ultrasound to evaluate left lower quadrant pain. Discuss with patient pain could even be caused from some constipation or stool moving in intestines. Please follow-up if symptoms worsen or do not improve.  Urine trace protein- unclear etiology. There does not appear to be an infection. Will culture urine. Will order CMP to evaluate renal function. Discuss with patient to have follow-up urinalysis in the next 2-4 weeks to make sure resolved.

## 2015-11-27 LAB — CBC WITH DIFFERENTIAL/PLATELET
Basophils Absolute: 45 cells/uL (ref 0–200)
Basophils Relative: 1 %
EOS PCT: 1 %
Eosinophils Absolute: 45 cells/uL (ref 15–500)
HCT: 38.2 % (ref 35.0–45.0)
Hemoglobin: 12.6 g/dL (ref 11.7–15.5)
LYMPHS ABS: 1080 {cells}/uL (ref 850–3900)
LYMPHS PCT: 24 %
MCH: 29 pg (ref 27.0–33.0)
MCHC: 33 g/dL (ref 32.0–36.0)
MCV: 88 fL (ref 80.0–100.0)
MONO ABS: 270 {cells}/uL (ref 200–950)
MPV: 10.7 fL (ref 7.5–12.5)
Monocytes Relative: 6 %
Neutro Abs: 3060 cells/uL (ref 1500–7800)
Neutrophils Relative %: 68 %
Platelets: 219 10*3/uL (ref 140–400)
RBC: 4.34 MIL/uL (ref 3.80–5.10)
RDW: 13.7 % (ref 11.0–15.0)
WBC: 4.5 10*3/uL (ref 3.8–10.8)

## 2015-11-27 LAB — COMPLETE METABOLIC PANEL WITH GFR
ALT: 12 U/L (ref 6–29)
AST: 20 U/L (ref 10–35)
Albumin: 4.2 g/dL (ref 3.6–5.1)
Alkaline Phosphatase: 76 U/L (ref 33–130)
BUN: 10 mg/dL (ref 7–25)
CALCIUM: 9.3 mg/dL (ref 8.6–10.4)
CHLORIDE: 100 mmol/L (ref 98–110)
CO2: 24 mmol/L (ref 20–31)
Creat: 0.97 mg/dL (ref 0.50–1.05)
GFR, EST AFRICAN AMERICAN: 77 mL/min (ref >=60)
GFR, Est Non African American: 67 mL/min (ref >=60)
Glucose, Bld: 79 mg/dL (ref 65–99)
POTASSIUM: 3.9 mmol/L (ref 3.5–5.3)
Sodium: 135 mmol/L (ref 135–146)
Total Bilirubin: 0.4 mg/dL (ref 0.2–1.2)
Total Protein: 7.4 g/dL (ref 6.1–8.1)

## 2015-11-28 ENCOUNTER — Encounter: Payer: Self-pay | Admitting: Physician Assistant

## 2015-11-28 DIAGNOSIS — D259 Leiomyoma of uterus, unspecified: Secondary | ICD-10-CM | POA: Insufficient documentation

## 2015-11-28 LAB — URINE CULTURE
Colony Count: NO GROWTH
ORGANISM ID, BACTERIA: NO GROWTH

## 2015-12-01 NOTE — Addendum Note (Signed)
Addended byDonella Stade on: 12/01/2015 04:00 PM   Modules accepted: Orders

## 2015-12-02 ENCOUNTER — Ambulatory Visit (INDEPENDENT_AMBULATORY_CARE_PROVIDER_SITE_OTHER): Payer: BLUE CROSS/BLUE SHIELD

## 2015-12-02 DIAGNOSIS — K769 Liver disease, unspecified: Secondary | ICD-10-CM | POA: Diagnosis not present

## 2015-12-02 DIAGNOSIS — R809 Proteinuria, unspecified: Secondary | ICD-10-CM

## 2015-12-02 DIAGNOSIS — N852 Hypertrophy of uterus: Secondary | ICD-10-CM

## 2015-12-02 DIAGNOSIS — K7689 Other specified diseases of liver: Secondary | ICD-10-CM | POA: Diagnosis not present

## 2015-12-02 DIAGNOSIS — R1032 Left lower quadrant pain: Secondary | ICD-10-CM

## 2015-12-02 MED ORDER — IOPAMIDOL (ISOVUE-300) INJECTION 61%
100.0000 mL | Freq: Once | INTRAVENOUS | Status: AC | PRN
  Administered 2015-12-02: 100 mL via INTRAVENOUS

## 2015-12-13 ENCOUNTER — Telehealth: Payer: Self-pay | Admitting: *Deleted

## 2015-12-13 DIAGNOSIS — K769 Liver disease, unspecified: Secondary | ICD-10-CM

## 2015-12-13 DIAGNOSIS — D259 Leiomyoma of uterus, unspecified: Secondary | ICD-10-CM

## 2015-12-13 NOTE — Telephone Encounter (Signed)
Referral placed & MRI ordered.

## 2015-12-20 ENCOUNTER — Ambulatory Visit (INDEPENDENT_AMBULATORY_CARE_PROVIDER_SITE_OTHER): Payer: BLUE CROSS/BLUE SHIELD | Admitting: Family Medicine

## 2015-12-20 ENCOUNTER — Ambulatory Visit (INDEPENDENT_AMBULATORY_CARE_PROVIDER_SITE_OTHER): Payer: BLUE CROSS/BLUE SHIELD

## 2015-12-20 VITALS — BP 154/77 | HR 74

## 2015-12-20 DIAGNOSIS — D1809 Hemangioma of other sites: Secondary | ICD-10-CM | POA: Diagnosis not present

## 2015-12-20 DIAGNOSIS — K7689 Other specified diseases of liver: Secondary | ICD-10-CM

## 2015-12-20 DIAGNOSIS — Z3202 Encounter for pregnancy test, result negative: Secondary | ICD-10-CM | POA: Diagnosis not present

## 2015-12-20 LAB — POCT URINE PREGNANCY: Preg Test, Ur: NEGATIVE

## 2015-12-20 MED ORDER — GADOBENATE DIMEGLUMINE 529 MG/ML IV SOLN
15.0000 mL | Freq: Once | INTRAVENOUS | Status: AC | PRN
  Administered 2015-12-20: 12 mL via INTRAVENOUS

## 2015-12-20 NOTE — Progress Notes (Signed)
Patient came into clinic today for urine pregnancy test prior to MRI. Pt was able to provide a urine sample, test was negative. Imaging notified. While in clinic, Pt BP was elevated. Pt thinks this is due to her upcoming imaging appointment. Went over values with PCP, advised Pt to return for BP nurse visit check in 2 weeks. Pt verbalized understanding, appt made prior to leaving clinic. No further questions/concerns.   Agree with above. Blood pressure still quite elevated but will monitor. Okay for imaging. Mariah Lecher, MD

## 2015-12-21 ENCOUNTER — Encounter: Payer: Self-pay | Admitting: Physician Assistant

## 2015-12-21 ENCOUNTER — Telehealth: Payer: Self-pay | Admitting: *Deleted

## 2015-12-21 DIAGNOSIS — R932 Abnormal findings on diagnostic imaging of liver and biliary tract: Secondary | ICD-10-CM | POA: Insufficient documentation

## 2015-12-21 NOTE — Telephone Encounter (Signed)
Patient called back in re to liver MRI. She wants to know what could have caused the liver lesions and what can she do to prevent them.Please advise

## 2015-12-22 NOTE — Telephone Encounter (Signed)
Call pt: hemangiomas are collection of blood vessels and focal hyperplasia is collection of tissue both are not worsen and our artifacts that are usually found by coincedience. There is no treatment needed and can't be prevented.

## 2015-12-22 NOTE — Telephone Encounter (Signed)
Pt.notified

## 2016-01-03 ENCOUNTER — Encounter: Payer: Self-pay | Admitting: Obstetrics & Gynecology

## 2016-01-03 ENCOUNTER — Ambulatory Visit (INDEPENDENT_AMBULATORY_CARE_PROVIDER_SITE_OTHER): Payer: BLUE CROSS/BLUE SHIELD | Admitting: Family Medicine

## 2016-01-03 ENCOUNTER — Ambulatory Visit (INDEPENDENT_AMBULATORY_CARE_PROVIDER_SITE_OTHER): Payer: BLUE CROSS/BLUE SHIELD | Admitting: Obstetrics & Gynecology

## 2016-01-03 VITALS — BP 182/97 | Wt 131.0 lb

## 2016-01-03 VITALS — BP 149/73 | HR 70 | Wt 132.0 lb

## 2016-01-03 DIAGNOSIS — N393 Stress incontinence (female) (male): Secondary | ICD-10-CM

## 2016-01-03 DIAGNOSIS — R309 Painful micturition, unspecified: Secondary | ICD-10-CM | POA: Diagnosis not present

## 2016-01-03 DIAGNOSIS — D251 Intramural leiomyoma of uterus: Secondary | ICD-10-CM | POA: Diagnosis not present

## 2016-01-03 DIAGNOSIS — I10 Essential (primary) hypertension: Secondary | ICD-10-CM | POA: Diagnosis not present

## 2016-01-03 LAB — POCT URINALYSIS DIPSTICK
BILIRUBIN UA: NEGATIVE
Blood, UA: NEGATIVE
Glucose, UA: NEGATIVE
Ketones, UA: NEGATIVE
LEUKOCYTES UA: NEGATIVE
NITRITE UA: NEGATIVE
PH UA: 6.5
PROTEIN UA: NEGATIVE
Spec Grav, UA: 1.015
UROBILINOGEN UA: NEGATIVE

## 2016-01-03 MED ORDER — HYDROCHLOROTHIAZIDE 12.5 MG PO TABS: 12.5000 mg | ORAL_TABLET | Freq: Every day | ORAL | 3 refills | Status: DC

## 2016-01-03 NOTE — Progress Notes (Signed)
Subjective:     Patient ID: Mariah Mckinney, female   DOB: [birth year], 55 y.o.   MRN: [id number]  HPI  55 year old female presents for left lower quadrant pain. It is in going on several weeks. Proximally since the middle of June. Patient saw her primary care provider who referred her here. Patient had a pelvic ultrasound which is as follows:  Uterus  Measurements: 11.5 x 6.1 x 6.4 cm. There is a 3.0 x 3.9 x 2.3 cm exophytic fibroid off the right aspect of the uterine fundus.  Endometrium  Difficult to visualize and measure.  Right ovary  Measurements: 1.6 x 1.6 x 1.4 cm. Normal appearance/no adnexal mass.  Left ovary  Measurements: 2.4 x 1.6 x 1.7 cm. Normal appearance/no adnexal mass.  Other findings  No abnormal free fluid.  IMPRESSION: Fibroid uterus.  No acute process identified.   Patient has had irregular menses for the past 2 years. Sometimes she skips a month. Sometimes she has them every 3 weeks. She does not have been every 2 weeks or spotting in between.  She is having some menopausal symptoms of hot flashes and flushes. She denies vaginal dryness.  Location: left lower quadrant Quality: sharp and sudden Severity: severe at times, getting a little better lately Duration: the pains last for a few minutes thensubside Context: none Timing: no associated with cycle Modifying factors: nont associated with BM Associated signs and symptoms: fullness over bladder.  Patient also complains of increasing fullness of her bladder. She is times urinates every hour when she drinks a lot of water. She also complaining of stress urinary incontinence when she coughs and sneezes. She does not have urinary urgency. She gets up once at night to urinate.    Review of Systems  Constitutional: Negative.   Respiratory: Negative.   Cardiovascular: Negative.   Gastrointestinal: Negative.   Endocrine: Negative for polyphagia and polyuria.  Genitourinary: Positive for  menstrual problem and pelvic pain. Negative for vaginal discharge and vaginal pain.  Musculoskeletal: Negative.   Allergic/Immunologic: Negative.   Neurological: Negative.   Hematological: Negative.   Psychiatric/Behavioral: Negative.    Past Medical History:  Diagnosis Date  . Connective tissue disease ([place])   . Hypertension   . Raynaud's disease        Objective:   Physical Exam  Constitutional: She is oriented to person, place, and time. She appears well-developed and well-nourished. No distress.  HENT:  Head: Normocephalic and atraumatic.  Eyes: Conjunctivae are normal.  Cardiovascular: Normal rate and regular rhythm.   Pulmonary/Chest: Effort normal and breath sounds normal.  Abdominal: Soft. Bowel sounds are normal. She exhibits no distension and no mass. There is no tenderness. There is no rebound and no guarding.  Genitourinary:  Genitourinary Comments: Vulva Tanner 5 Vagina pale pink no lesion Urethra no prolapse Cervix no lesion Uterus globular mobile and mildly tender Adnexa no masses nontender Ovary left ischial spine there is a tender band which feels a wrist like a ligament or spasmed muscle. Bladder mild cystocele   Musculoskeletal: She exhibits no edema.  Neurological: She is alert and oriented to person, place, and time.  Skin: Skin is warm and dry.  Psychiatric: She has a normal mood and affect.  Vitals reviewed.      Assessment:     Fibroid uterus and pelvicpain on left for 6 weeks Stress urinary incontinence Mild cystocele    Plan:     Patient desires definitive surgical management for the pain and urinary incontinence. She  prefers an open procedure at this time. We had extensive discussion about robotic laparoscopic and open. She also understands the endometrial biopsy will be warranted prior to the hysterectomy. I told her there was approximately 70% chance that the pelvic pain was due to her fibroid uterus. Given the pain over the left ischial  spine, there is a chance that this pain is due to dysfunctional the pelvic diaphragm and she would need physical therapy. Hysterectomy would alleviate the symptoms of pressure over her bladder. A cystocele repair and sling will help her urinary incontinence. Dr. Clovia Cuff will do consult for procedure as I do not do slings.    Patient was prescribed a second hypertensive medication by her primary care provider. She is aware her blood pressure is very elevated stay and she must start a second medication.

## 2016-01-03 NOTE — Progress Notes (Signed)
Patient came into clinic today for a 2 week BP check. Pt reports taking all of her medications, but does report an increase in headaches. Questions if this could be due to her uncontrolled hypertension. Pt reports taking her Metoprolol BID, no complications.   Spoke with PCP, since Pt BP has been elevated the last 3 office visits Pt can either increase the Metoprolol or add a second medication (HCTZ). Spoke with Pt regarding both options, Pt would like to try the HCTZ. Request a 30 day supply be sent to her local ([facility]) pharmacy. If this Rx becomes long term then it can be sent to mail order.  Will route to PCP for new Rx to be written. Advised Pt I would contact her and advise when she should come in for her next BP check after beginning new Rx. Verbalized understanding.    Agree with above.  [name], MD

## 2016-01-03 NOTE — Patient Instructions (Signed)
Urinary Incontinence Urinary incontinence is the involuntary loss of urine from your bladder. CAUSES  There are many causes of urinary incontinence. They include:  Medicines.  Infections.  Prostatic enlargement, leading to overflow of urine from your bladder.  Surgery.  Neurological diseases.  Emotional factors. SIGNS AND SYMPTOMS Urinary Incontinence can be divided into four types: 1. Urge incontinence. Urge incontinence is the involuntary loss of urine before you have the opportunity to go to the bathroom. There is a sudden urge to void but not enough time to reach a bathroom. 2. Stress incontinence. Stress incontinence is the sudden loss of urine with any activity that forces urine to pass. It is commonly caused by anatomical changes to the pelvis and sphincter areas of your body. 3. Overflow incontinence. Overflow incontinence is the loss of urine from an obstructed opening to your bladder. This results in a backup of urine and a resultant buildup of pressure within the bladder. When the pressure within the bladder exceeds the closing pressure of the sphincter, the urine overflows, which causes incontinence, similar to water overflowing a dam. 4. Total incontinence. Total incontinence is the loss of urine as a result of the inability to store urine within your bladder. DIAGNOSIS  Evaluating the cause of incontinence may require:  A thorough and complete medical and obstetric history.  A complete physical exam.  Laboratory tests such as a urine culture and sensitivities. When additional tests are indicated, they can include:  An ultrasound exam.  Kidney and bladder X-rays.  Cystoscopy. This is an exam of the bladder using a narrow scope.  Urodynamic testing to test the nerve function to the bladder and sphincter areas. TREATMENT  Treatment for urinary incontinence depends on the cause:  For urge incontinence caused by a bacterial infection, antibiotics will be prescribed.  If the urge incontinence is related to medicines you take, your health care provider may have you change the medicine.  For stress incontinence, surgery to re-establish anatomical support to the bladder or sphincter, or both, will often correct the condition.  For overflow incontinence caused by an enlarged prostate, an operation to open the channel through the enlarged prostate will allow the flow of urine out of the bladder. In women with fibroids, a hysterectomy may be recommended.  For total incontinence, surgery on your urinary sphincter may help. An artificial urinary sphincter (an inflatable cuff placed around the urethra) may be required. In women who have developed a hole-like passage between their bladder and vagina (vesicovaginal fistula), surgery to close the fistula often is required. HOME CARE INSTRUCTIONS  Normal daily hygiene and the use of pads or adult diapers that are changed regularly will help prevent odors and skin damage.  Avoid caffeine. It can overstimulate your bladder.  Use the bathroom regularly. Try about every 2-3 hours to go to the bathroom, even if you do not feel the need to do so. Take time to empty your bladder completely. After urinating, wait a minute. Then try to urinate again.  For causes involving nerve dysfunction, keep a log of the medicines you take and a journal of the times you go to the bathroom. SEEK MEDICAL CARE IF:  You experience worsening of pain instead of improvement in pain after your procedure.  Your incontinence becomes worse instead of better. SEE IMMEDIATE MEDICAL CARE IF:  You experience fever or shaking chills.  You are unable to pass your urine.  You have redness spreading into your groin or down into your thighs. MAKE SURE   YOU:   Understand these instructions.   Will watch your condition.  Will get help right away if you are not doing well or get worse.   This information is not intended to replace advice given to you  by your health care provider. Make sure you discuss any questions you have with your health care provider.   Document Released: 07/06/2004 Document Revised: 06/19/2014 Document Reviewed: 11/05/2012 Elsevier Interactive Patient Education 2016 Else[employer] Uterine Fibroids Uterine fibroids are tissue masses (tumors) that can develop in the womb (uterus). They are also called leiomyomas. This type of tumor is not cancerous (benign) and does not spread to other parts of the body outside of the pelvic area, which is between the hip bones. Occasionally, fibroids may develop in the fallopian tubes, in the cervix, or on the support structures (ligaments) that surround the uterus. You can have one or many fibroids. Fibroids can vary in size, weight, and where they grow in the uterus. Some can become quite large. Most fibroids do not require medical treatment. CAUSES A fibroid can develop when a single uterine cell keeps growing (replicating). Most cells in the human body have a control mechanism that keeps them from replicating without control. SIGNS AND SYMPTOMS Symptoms may include:   Heavy bleeding during your period.  Bleeding or spotting between periods.  Pelvic pain and pressure.  Bladder problems, such as needing to urinate more often (urinary frequency) or urgently.  Inability to reproduce offspring (infertility).  Miscarriages. DIAGNOSIS Uterine fibroids are diagnosed through a physical exam. Your health care provider may feel the lumpy tumors during a pelvic exam. Ultrasonography and an MRI may be done to determine the size, location, and number of fibroids. TREATMENT Treatment may include:  Watchful waiting. This involves getting the fibroid checked by your health care provider to see if it grows or shrinks. Follow your health care provider's recommendations for how often to have this checked.  Hormone medicines. These can be taken by mouth or given through an intrauterine device  (IUD).  Surgery.  Removing the fibroids (myomectomy) or the uterus (hysterectomy).  Removing blood supply to the fibroids (uterine artery embolization). If fibroids interfere with your fertility and you want to become pregnant, your health care provider may recommend having the fibroids removed.  HOME CARE INSTRUCTIONS  Keep all follow-up visits as directed by your health care provider. This is important.  Take medicines only as directed by your health care provider.  If you were prescribed a hormone treatment, take the hormone medicines exactly as directed.  Do not take aspirin, because it can cause bleeding.  Ask your health care provider about taking iron pills and increasing the amount of dark green, leafy vegetables in your diet. These actions can help to boost your blood iron levels, which may be affected by heavy menstrual bleeding.  Pay close attention to your period and tell your health care provider about any changes, such as:  Increased blood flow that requires you to use more pads or tampons than usual per month.  A change in the number of days that your period lasts per month.  A change in symptoms that are associated with your period, such as abdominal cramping or back pain. SEEK MEDICAL CARE IF:  You have pelvic pain, back pain, or abdominal cramps that cannot be controlled with medicines.  You have an increase in bleeding between and during periods.  You soak tampons or pads in a half hour or less.  You feel lightheaded,  extra tired, or weak. SEEK IMMEDIATE MEDICAL CARE IF:  You faint.  You have a sudden increase in pelvic pain.   This information is not intended to replace advice given to you by your health care provider. Make sure you discuss any questions you have with your health care provider.   Document Released: 05/26/2000 Document Revised: 06/19/2014 Document Reviewed: 11/25/2013 Elsevier Interactive Patient Education [employer].

## 2016-01-05 LAB — CULTURE, URINE COMPREHENSIVE: Organism ID, Bacteria: NO GROWTH

## 2016-01-07 ENCOUNTER — Telehealth: Payer: Self-pay | Admitting: *Deleted

## 2016-01-07 NOTE — Telephone Encounter (Signed)
Pt notified of neg urine culture.

## 2016-01-07 NOTE — Telephone Encounter (Signed)
-----   Message from Guss Bunde, MD sent at 01/07/2016  5:45 AM EDT ----- Call the patient and let them know their urine culture is normal.  Thanks!!

## 2016-01-12 ENCOUNTER — Ambulatory Visit: Payer: BLUE CROSS/BLUE SHIELD | Admitting: Obstetrics & Gynecology

## 2016-01-20 ENCOUNTER — Other Ambulatory Visit: Payer: Self-pay | Admitting: Family Medicine

## 2016-01-21 NOTE — Telephone Encounter (Signed)
This is a historical medication. Last lipid panel was 01/2015.

## 2016-03-06 DIAGNOSIS — Z79899 Other long term (current) drug therapy: Secondary | ICD-10-CM | POA: Diagnosis not present

## 2016-03-06 DIAGNOSIS — M359 Systemic involvement of connective tissue, unspecified: Secondary | ICD-10-CM | POA: Diagnosis not present

## 2016-03-17 ENCOUNTER — Other Ambulatory Visit: Payer: Self-pay | Admitting: Family Medicine

## 2016-03-17 DIAGNOSIS — Z1231 Encounter for screening mammogram for malignant neoplasm of breast: Secondary | ICD-10-CM

## 2016-03-21 ENCOUNTER — Ambulatory Visit: Payer: BLUE CROSS/BLUE SHIELD

## 2016-03-21 ENCOUNTER — Ambulatory Visit (INDEPENDENT_AMBULATORY_CARE_PROVIDER_SITE_OTHER): Payer: BLUE CROSS/BLUE SHIELD

## 2016-03-21 DIAGNOSIS — Z1231 Encounter for screening mammogram for malignant neoplasm of breast: Secondary | ICD-10-CM | POA: Diagnosis not present

## 2016-03-27 DIAGNOSIS — Z23 Encounter for immunization: Secondary | ICD-10-CM | POA: Diagnosis not present

## 2016-04-09 ENCOUNTER — Other Ambulatory Visit: Payer: Self-pay | Admitting: Family Medicine

## 2016-04-10 ENCOUNTER — Other Ambulatory Visit: Payer: Self-pay

## 2016-04-10 MED ORDER — NORELGESTROMIN-ETH ESTRADIOL 150-35 MCG/24HR TD PTWK: 1.0000 | MEDICATED_PATCH | TRANSDERMAL | 3 refills | Status: DC

## 2016-05-02 ENCOUNTER — Other Ambulatory Visit: Payer: Self-pay | Admitting: Family Medicine

## 2016-05-06 ENCOUNTER — Other Ambulatory Visit: Payer: Self-pay | Admitting: Family Medicine

## 2016-05-22 ENCOUNTER — Encounter: Payer: Self-pay | Admitting: Family Medicine

## 2016-05-22 ENCOUNTER — Ambulatory Visit (INDEPENDENT_AMBULATORY_CARE_PROVIDER_SITE_OTHER): Payer: BLUE CROSS/BLUE SHIELD | Admitting: Family Medicine

## 2016-05-22 VITALS — BP 132/80 | HR 69 | Temp 98.3°F | Wt 134.0 lb

## 2016-05-22 DIAGNOSIS — L0100 Impetigo, unspecified: Secondary | ICD-10-CM | POA: Diagnosis not present

## 2016-05-22 MED ORDER — MUPIROCIN 2 % EX OINT: TOPICAL_OINTMENT | CUTANEOUS | 3 refills | Status: DC

## 2016-05-22 NOTE — Patient Instructions (Signed)
Thank you for coming in today. Use the antibiotic ointment.  Return as needed.   Impetigo, Adult Impetigo is an infection of the skin. It commonly occurs in young children, but it can also occur in adults. The infection causes itchy blisters and sores that produce brownish-yellow fluid. As the fluid dries, it forms a thick, honey-colored crust. These skin changes usually occur on the face but can also affect other areas of the body. Impetigo usually goes away in 7-10 days with treatment. CAUSES Impetigo is caused by two types of bacteria. It may be caused by staphylococci or streptococci bacteria. These bacteria cause impetigo when they get under the surface of the skin. This often happens after some damage to the skin, such as damage from:  Cuts, scrapes, or scratches.  Insect bites, especially when you scratch the area of a bite.  Chickenpox or other illnesses that cause open skin sores.  Nail biting or chewing. Impetigo is contagious and can spread easily from one person to another. This may occur through close skin contact or by sharing towels, clothing, or other items with a person who has the infection. RISK FACTORS Some things that can increase the risk of getting this infection include:  Playing sports that include skin-to-skin contact with others.  Having a skin condition with open sores.  Having many skin cuts or scrapes.  Living in an area that has high humidity levels.  Having poor hygiene.  Having high levels of staphylococci in your nose. SIGNS AND SYMPTOMS Impetigo usually starts out as small blisters, often on the face. The blisters then break open and turn into tiny sores (lesions) with a yellow crust. In some cases, the blisters cause itching or burning. With scratching, irritation, or lack of treatment, these small lesions may get larger. Scratching can also cause impetigo to spread to other parts of the body. The bacteria can get under the fingernails and spread  when you touch another area of your skin. Other possible symptoms include:  Larger blisters.  Pus.  Swollen lymph glands. DIAGNOSIS This condition is usually diagnosed during a physical exam. A skin sample or sample of fluid from a blister may be taken for lab tests that involve growing bacteria (culture test). This can help confirm the diagnosis or help determine the best treatment. TREATMENT Mild impetigo can be treated with prescription antibiotic cream. Oral antibiotic medicine may be used in more severe cases. Medicines for itching may also be used. HOME CARE INSTRUCTIONS  Take medicines only as directed by your health care provider.  To help prevent impetigo from spreading to other body areas:  Keep your fingernails short and clean.  Do not scratch the blisters or sores.  Cover infected areas, if necessary, to keep from scratching.  Gently wash the infected areas with antibiotic soap and water.  Soak crusted areas in warm, soapy water using antibiotic soap.  Gently rub the areas to remove crusts. Do not scrub.  Wash your hands often to avoid spreading this infection.  Stay home until you have used an antibiotic cream for 48 hours (2 days) or an oral antibiotic medicine for 24 hours (1 day). You should only return to work and activities with other people if your skin shows significant improvement. PREVENTION  To keep the infection from spreading:  Stay home until you have used an antibiotic cream for 48 hours or an oral antibiotic for 24 hours.  Wash your hands often.  Do not engage in skin-to-skin contact with other people  while you have still have blisters.  Do not share towels, washcloths, or bedding with others while you have the infection. SEEK MEDICAL CARE IF:  You develop more blisters or sores despite treatment.  Other family members get sores.  Your skin sores are not improving after 48 hours of treatment.  You have a fever. SEEK IMMEDIATE MEDICAL  CARE IF:  You see spreading redness or swelling of the skin around your sores.  You see red streaks coming from your sores.  You develop a sore throat. This information is not intended to replace advice given to you by your health care provider. Make sure you discuss any questions you have with your health care provider. Document Released: 06/19/2014 Document Reviewed: 06/19/2014 Elsevier Interactive Patient Education  2017 [employer].

## 2016-05-22 NOTE — Progress Notes (Signed)
Mariah Mckinney is a 55 y.o. female who presents to [facility] today for facial rash.  For the past 4 days, she has had itching on the left side of her face just lateral to her eye. She noticed red bumps there initially that have since changed to look like ruptured vesicles, although she denies noticing vesicles. Hydrocortisone cream has helped with itching, and allergy relief eyedrops have helped with redness and itching. Denies vision changes, discharge, eye pain, facial pain, preceding trauma or chemical/environmental exposures. She had the same symptoms several years ago which self resolved after 1 week.   Past Medical History:  Diagnosis Date  . Connective tissue disease ([place])   . Hypertension   . Raynaud's disease    Past Surgical History:  Procedure Laterality Date  . CARDIAC ELECTROPHYSIOLOGY MAPPING AND ABLATION    . HERNIA REPAIR  06-2005   Social History  Substance Use Topics  . Smoking status: Never Smoker  . Smokeless tobacco: Not on file  . Alcohol use No     ROS:  As above   Medications: Current Outpatient Prescriptions  Medication Sig Dispense Refill  . atorvastatin (LIPITOR) 20 MG tablet Take 1 tablet (20 mg total) by mouth daily. NEED LAB WORK 30 tablet 0  . [facility] ASPIRIN ADULT LOW DOSE 81 MG chewable tablet TAKE 1 TABLET (81 MG TOTAL) BY MOUTH DAILY FOR 30 DAYS. 30 tablet 10  . hydrochlorothiazide (HYDRODIURIL) 12.5 MG tablet Take 1 tablet (12.5 mg total) by mouth daily. LAST REFILL. PLEASE SCHEDULE AN APPOINTMENT. LABS NEEDED. 30 tablet 0  . hydroxychloroquine (PLAQUENIL) 200 MG tablet Take 200 mg by mouth 2 (two) times daily.     . metoprolol succinate (TOPROL-XL) 50 MG 24 hr tablet Take 50 mg by mouth 2 (two) times daily.  11  . NABUMETONE PO Take 750 mg by mouth 2 (two) times daily.      . norelgestromin-ethinyl estradiol Marilu Favre) 150-35 MCG/24HR transdermal patch Place 1 patch onto the skin once a week. 9  patch 3  . mupirocin ointment (BACTROBAN) 2 % Apply to affected area TID for 7 days. 30 g 3   No current facility-administered medications for this visit.    No Known Allergies   Exam:  BP 132/80   Pulse 69   Temp 98.3 F (36.8 C) (Oral)   Wt 134 lb (60.8 kg)   SpO2 99%   BMI 21.96 kg/m  General: Well Developed, well nourished, and in no acute distress.  HEENT: Left eye without edema or erythema. Cluster of papules on the skin directly lateral to the left eye, each <50mm in diameter with dark crust overlying an erythematous base. Vision 20/30 in right eye, 20/40 in left eye. Neuro/Psych: Alert and oriented x3, extra-ocular muscles intact, able to move all 4 extremities, sensation grossly intact. Skin: Warm and dry, facial rash as above. Respiratory: Not using accessory muscles, speaking in full sentences, trachea midline.  Cardiovascular: Pulses palpable, no extremity edema. Abdomen: Does not appear distended.   No results found for this or any previous visit (from the past 48 hour(s)). No results found.  Assessment and Plan: 55 y.o. female with crusted facial lesions with erythema and itching. Unlikely to be uveitis in the setting of RA given absence of eyeball involvement or shingles given absence of pain. Most likely impetigo following seborrheic dermatitis. Treat with mupirocin ointment.   No orders of the defined types were placed in this encounter.   Discussed  warning signs or symptoms. Please see discharge instructions. Patient expresses understanding.

## 2016-06-01 ENCOUNTER — Other Ambulatory Visit: Payer: Self-pay | Admitting: Family Medicine

## 2016-06-04 ENCOUNTER — Other Ambulatory Visit: Payer: Self-pay | Admitting: Family Medicine

## 2016-07-10 ENCOUNTER — Ambulatory Visit (INDEPENDENT_AMBULATORY_CARE_PROVIDER_SITE_OTHER): Payer: BLUE CROSS/BLUE SHIELD | Admitting: Family Medicine

## 2016-07-10 ENCOUNTER — Encounter: Payer: Self-pay | Admitting: Family Medicine

## 2016-07-10 VITALS — BP 141/69 | HR 66 | Ht 66.0 in | Wt 132.0 lb

## 2016-07-10 DIAGNOSIS — R7989 Other specified abnormal findings of blood chemistry: Secondary | ICD-10-CM

## 2016-07-10 DIAGNOSIS — Z0000 Encounter for general adult medical examination without abnormal findings: Secondary | ICD-10-CM

## 2016-07-10 DIAGNOSIS — K769 Liver disease, unspecified: Secondary | ICD-10-CM | POA: Diagnosis not present

## 2016-07-10 DIAGNOSIS — I10 Essential (primary) hypertension: Secondary | ICD-10-CM

## 2016-07-10 DIAGNOSIS — R799 Abnormal finding of blood chemistry, unspecified: Secondary | ICD-10-CM | POA: Diagnosis not present

## 2016-07-10 LAB — COMPLETE METABOLIC PANEL WITH GFR
ALT: 10 U/L (ref 6–29)
AST: 16 U/L (ref 10–35)
Albumin: 4.3 g/dL (ref 3.6–5.1)
Alkaline Phosphatase: 63 U/L (ref 33–130)
BILIRUBIN TOTAL: 0.6 mg/dL (ref 0.2–1.2)
BUN: 8 mg/dL (ref 7–25)
CALCIUM: 9.5 mg/dL (ref 8.6–10.4)
CHLORIDE: 102 mmol/L (ref 98–110)
CO2: 25 mmol/L (ref 20–31)
CREATININE: 0.97 mg/dL (ref 0.50–1.05)
GFR, Est African American: 77 mL/min (ref >=60)
GFR, Est Non African American: 66 mL/min (ref >=60)
Glucose, Bld: 69 mg/dL (ref 65–99)
Potassium: 4.2 mmol/L (ref 3.5–5.3)
Sodium: 136 mmol/L (ref 135–146)
TOTAL PROTEIN: 7.9 g/dL (ref 6.1–8.1)

## 2016-07-10 LAB — LIPID PANEL
CHOLESTEROL: 163 mg/dL (ref <200)
HDL: 74 mg/dL (ref >50)
LDL CALC: 77 mg/dL (ref <100)
Total CHOL/HDL Ratio: 2.2 Ratio (ref <5.0)
Triglycerides: 62 mg/dL (ref <150)
VLDL: 12 mg/dL (ref <30)

## 2016-07-10 MED ORDER — ATORVASTATIN CALCIUM 20 MG PO TABS: 20.0000 mg | ORAL_TABLET | Freq: Every day | ORAL | 3 refills | Status: DC

## 2016-07-10 MED ORDER — HYDROCHLOROTHIAZIDE 12.5 MG PO TABS: 12.5000 mg | ORAL_TABLET | Freq: Every day | ORAL | 3 refills | Status: DC

## 2016-07-10 NOTE — Patient Instructions (Signed)
Keep up a regular exercise program and make sure you are eating a healthy diet Try to eat 4 servings of dairy a day, or if you are lactose intolerant take a calcium with vitamin D daily.  Your vaccines are up to date.   

## 2016-07-10 NOTE — Progress Notes (Signed)
Subjective:     Mariah Mckinney is a 56 y.o. female and is here for a comprehensive physical exam. The patient reports no problems.She did want to give me an update though. She didn't end up seeing GYN for uterine fibroids. She was having a lot of pelvic pressure and discomfort. They've recommended surgical excision her pain is actually been better recently says she's actually held off. She is also due for repeat MRI of the abdomen for liver lesions that were found about 6 months ago. She is still taking metoprolol for her SVT/palpitations and this seems to be working well.  She did get her flu shot in October.  Social History   Social History  . Marital status: Married    Spouse name: N/A  . Number of children: 1   . Years of education: N/A   Occupational History  . Stay at home mom    Social History Main Topics  . Smoking status: Never Smoker  . Smokeless tobacco: Not on file  . Alcohol use No  . Drug use: No  . Sexual activity: Yes    Partners: Male     Comment: married, 28 yr old son, walks daily.   Other Topics Concern  . Not on file   Social History Narrative   Walking for exercise.    Health Maintenance  Topic Date Due  . INFLUENZA VACCINE  01/11/2016  . PAP SMEAR  01/12/2018  . MAMMOGRAM  03/21/2018  . TETANUS/TDAP  12/12/2021  . COLONOSCOPY  01/09/2024  . Hepatitis C Screening  Completed  . HIV Screening  Completed    The following portions of the patient's history were reviewed and updated as appropriate: allergies, current medications, past family history, past medical history, past social history, past surgical history and problem list.  Review of Systems A comprehensive review of systems was negative.   Objective:    BP (!) 141/69   Pulse 66   Ht 5\' 6"  (1.676 m)   Wt 132 lb (59.9 kg)   SpO2 100%   BMI 21.31 kg/m  General appearance: alert, cooperative and appears stated age Head: Normocephalic, without obvious abnormality, atraumatic Eyes: conj  clear, EOMi, PEERLA Ears: normal TM's and external ear canals both ears Nose: Nares normal. Septum midline. Mucosa normal. No drainage or sinus tenderness. Throat: lips, mucosa, and tongue normal; teeth and gums normal Neck: no adenopathy, no carotid bruit, no JVD, supple, symmetrical, trachea midline and thyroid not enlarged, symmetric, no tenderness/mass/nodules Back: symmetric, no curvature. ROM normal. No CVA tenderness. Lungs: clear to auscultation bilaterally Breasts: normal appearance, no masses or tenderness Heart: regular rate and rhythm, S1, S2 normal, no murmur, click, rub or gallop Abdomen: soft, non-tender; bowel sounds normal; no masses,  no organomegaly Extremities: extremities normal, atraumatic, no cyanosis or edema Pulses: 2+ and symmetric Skin: Skin color, texture, turgor normal. No rashes or lesions Lymph nodes: Cervical, supraclavicular, and axillary nodes normal. Neurologic: Alert and oriented X 3, normal strength and tone. Normal symmetric reflexes. Normal coordination and gait    Assessment:    Healthy female exam.      Plan:     See After Visit Summary for Counseling Recommendations   Keep up a regular exercise program and make sure you are eating a healthy diet Try to eat 4 servings of dairy a day, or if you are lactose intolerant take a calcium with vitamin D daily.  Your vaccines are up to date.   He is also due for  six-month follow-up for liver lesion seen on previous abdominal scan. They recommended MRI with and without contrast for further evaluation. Today.

## 2016-07-10 NOTE — Addendum Note (Signed)
Addended by: [name] D on: 07/10/2016 11:20 AM   Modules accepted: Orders

## 2016-07-11 NOTE — Progress Notes (Signed)
All labs are normal. 

## 2016-07-17 ENCOUNTER — Ambulatory Visit: Payer: BLUE CROSS/BLUE SHIELD

## 2016-07-17 DIAGNOSIS — K7689 Other specified diseases of liver: Secondary | ICD-10-CM | POA: Diagnosis not present

## 2016-07-17 MED ORDER — GADOXETATE DISODIUM 0.25 MMOL/ML IV SOLN
10.0000 mL | Freq: Once | INTRAVENOUS | Status: AC | PRN
  Administered 2016-07-17: 6 mL via INTRAVENOUS

## 2016-09-04 DIAGNOSIS — Z79899 Other long term (current) drug therapy: Secondary | ICD-10-CM | POA: Diagnosis not present

## 2016-09-04 DIAGNOSIS — M359 Systemic involvement of connective tissue, unspecified: Secondary | ICD-10-CM | POA: Diagnosis not present

## 2016-09-27 DIAGNOSIS — Z79899 Other long term (current) drug therapy: Secondary | ICD-10-CM | POA: Diagnosis not present

## 2016-09-29 DIAGNOSIS — Z79899 Other long term (current) drug therapy: Secondary | ICD-10-CM | POA: Diagnosis not present

## 2016-09-29 DIAGNOSIS — M069 Rheumatoid arthritis, unspecified: Secondary | ICD-10-CM | POA: Diagnosis not present

## 2016-09-29 DIAGNOSIS — H1851 Endothelial corneal dystrophy: Secondary | ICD-10-CM | POA: Diagnosis not present

## 2016-09-29 DIAGNOSIS — H2513 Age-related nuclear cataract, bilateral: Secondary | ICD-10-CM | POA: Diagnosis not present

## 2016-10-11 ENCOUNTER — Other Ambulatory Visit: Payer: Self-pay | Admitting: Family Medicine

## 2016-11-23 DIAGNOSIS — Z7982 Long term (current) use of aspirin: Secondary | ICD-10-CM | POA: Diagnosis not present

## 2016-11-23 DIAGNOSIS — I471 Supraventricular tachycardia: Secondary | ICD-10-CM | POA: Diagnosis not present

## 2016-11-24 DIAGNOSIS — I471 Supraventricular tachycardia: Secondary | ICD-10-CM | POA: Diagnosis not present

## 2017-01-26 DIAGNOSIS — E871 Hypo-osmolality and hyponatremia: Secondary | ICD-10-CM | POA: Diagnosis not present

## 2017-01-26 DIAGNOSIS — E785 Hyperlipidemia, unspecified: Secondary | ICD-10-CM | POA: Diagnosis not present

## 2017-01-26 DIAGNOSIS — R0602 Shortness of breath: Secondary | ICD-10-CM | POA: Diagnosis not present

## 2017-01-26 DIAGNOSIS — I10 Essential (primary) hypertension: Secondary | ICD-10-CM | POA: Diagnosis not present

## 2017-01-26 DIAGNOSIS — R079 Chest pain, unspecified: Secondary | ICD-10-CM | POA: Diagnosis not present

## 2017-01-26 DIAGNOSIS — I471 Supraventricular tachycardia: Secondary | ICD-10-CM | POA: Diagnosis not present

## 2017-01-26 DIAGNOSIS — R0789 Other chest pain: Secondary | ICD-10-CM | POA: Diagnosis not present

## 2017-01-26 DIAGNOSIS — R42 Dizziness and giddiness: Secondary | ICD-10-CM | POA: Diagnosis not present

## 2017-01-26 DIAGNOSIS — R000 Tachycardia, unspecified: Secondary | ICD-10-CM | POA: Diagnosis not present

## 2017-01-26 DIAGNOSIS — I340 Nonrheumatic mitral (valve) insufficiency: Secondary | ICD-10-CM | POA: Diagnosis not present

## 2017-01-26 DIAGNOSIS — Z7982 Long term (current) use of aspirin: Secondary | ICD-10-CM | POA: Diagnosis not present

## 2017-01-26 DIAGNOSIS — L668 Other cicatricial alopecia: Secondary | ICD-10-CM | POA: Diagnosis not present

## 2017-01-26 DIAGNOSIS — I959 Hypotension, unspecified: Secondary | ICD-10-CM | POA: Diagnosis not present

## 2017-01-26 DIAGNOSIS — E872 Acidosis: Secondary | ICD-10-CM | POA: Diagnosis not present

## 2017-01-27 DIAGNOSIS — I471 Supraventricular tachycardia: Secondary | ICD-10-CM | POA: Diagnosis not present

## 2017-01-27 DIAGNOSIS — E785 Hyperlipidemia, unspecified: Secondary | ICD-10-CM | POA: Diagnosis not present

## 2017-01-27 DIAGNOSIS — I10 Essential (primary) hypertension: Secondary | ICD-10-CM | POA: Diagnosis not present

## 2017-01-27 DIAGNOSIS — R000 Tachycardia, unspecified: Secondary | ICD-10-CM | POA: Diagnosis not present

## 2017-01-29 DIAGNOSIS — R9431 Abnormal electrocardiogram [ECG] [EKG]: Secondary | ICD-10-CM | POA: Diagnosis not present

## 2017-01-29 DIAGNOSIS — I471 Supraventricular tachycardia: Secondary | ICD-10-CM | POA: Diagnosis not present

## 2017-02-02 DIAGNOSIS — Z7982 Long term (current) use of aspirin: Secondary | ICD-10-CM | POA: Diagnosis not present

## 2017-02-02 DIAGNOSIS — I471 Supraventricular tachycardia: Secondary | ICD-10-CM | POA: Diagnosis not present

## 2017-02-02 DIAGNOSIS — R002 Palpitations: Secondary | ICD-10-CM | POA: Diagnosis not present

## 2017-02-06 DIAGNOSIS — R9431 Abnormal electrocardiogram [ECG] [EKG]: Secondary | ICD-10-CM | POA: Diagnosis not present

## 2017-02-25 ENCOUNTER — Other Ambulatory Visit: Payer: Self-pay | Admitting: Family Medicine

## 2017-03-05 DIAGNOSIS — I10 Essential (primary) hypertension: Secondary | ICD-10-CM | POA: Diagnosis not present

## 2017-03-05 DIAGNOSIS — I471 Supraventricular tachycardia: Secondary | ICD-10-CM | POA: Diagnosis not present

## 2017-03-06 DIAGNOSIS — I10 Essential (primary) hypertension: Secondary | ICD-10-CM | POA: Diagnosis not present

## 2017-03-06 DIAGNOSIS — Z9889 Other specified postprocedural states: Secondary | ICD-10-CM

## 2017-03-06 DIAGNOSIS — Z8679 Personal history of other diseases of the circulatory system: Secondary | ICD-10-CM | POA: Insufficient documentation

## 2017-03-06 DIAGNOSIS — I471 Supraventricular tachycardia: Secondary | ICD-10-CM | POA: Diagnosis not present

## 2017-03-11 DIAGNOSIS — R000 Tachycardia, unspecified: Secondary | ICD-10-CM | POA: Diagnosis not present

## 2017-04-17 DIAGNOSIS — R002 Palpitations: Secondary | ICD-10-CM | POA: Diagnosis not present

## 2017-04-17 DIAGNOSIS — I10 Essential (primary) hypertension: Secondary | ICD-10-CM | POA: Diagnosis not present

## 2017-04-17 DIAGNOSIS — Z833 Family history of diabetes mellitus: Secondary | ICD-10-CM | POA: Diagnosis not present

## 2017-04-17 DIAGNOSIS — R069 Unspecified abnormalities of breathing: Secondary | ICD-10-CM | POA: Diagnosis not present

## 2017-04-17 DIAGNOSIS — Z8679 Personal history of other diseases of the circulatory system: Secondary | ICD-10-CM | POA: Diagnosis not present

## 2017-04-17 DIAGNOSIS — I471 Supraventricular tachycardia: Secondary | ICD-10-CM | POA: Diagnosis not present

## 2017-04-17 DIAGNOSIS — Z809 Family history of malignant neoplasm, unspecified: Secondary | ICD-10-CM | POA: Diagnosis not present

## 2017-04-17 DIAGNOSIS — R110 Nausea: Secondary | ICD-10-CM | POA: Diagnosis not present

## 2017-04-17 DIAGNOSIS — R7989 Other specified abnormal findings of blood chemistry: Secondary | ICD-10-CM | POA: Diagnosis not present

## 2017-04-17 DIAGNOSIS — Z7901 Long term (current) use of anticoagulants: Secondary | ICD-10-CM | POA: Diagnosis not present

## 2017-04-17 DIAGNOSIS — M1990 Unspecified osteoarthritis, unspecified site: Secondary | ICD-10-CM | POA: Diagnosis not present

## 2017-04-17 DIAGNOSIS — E785 Hyperlipidemia, unspecified: Secondary | ICD-10-CM | POA: Diagnosis not present

## 2017-04-17 DIAGNOSIS — Z9889 Other specified postprocedural states: Secondary | ICD-10-CM | POA: Diagnosis not present

## 2017-04-17 DIAGNOSIS — I248 Other forms of acute ischemic heart disease: Secondary | ICD-10-CM | POA: Diagnosis not present

## 2017-04-17 DIAGNOSIS — R0602 Shortness of breath: Secondary | ICD-10-CM | POA: Diagnosis not present

## 2017-04-17 DIAGNOSIS — E860 Dehydration: Secondary | ICD-10-CM | POA: Diagnosis not present

## 2017-04-17 DIAGNOSIS — N179 Acute kidney failure, unspecified: Secondary | ICD-10-CM | POA: Diagnosis not present

## 2017-04-18 DIAGNOSIS — E785 Hyperlipidemia, unspecified: Secondary | ICD-10-CM | POA: Diagnosis not present

## 2017-04-18 DIAGNOSIS — Z9889 Other specified postprocedural states: Secondary | ICD-10-CM | POA: Diagnosis not present

## 2017-04-18 DIAGNOSIS — R002 Palpitations: Secondary | ICD-10-CM | POA: Diagnosis not present

## 2017-04-18 DIAGNOSIS — I10 Essential (primary) hypertension: Secondary | ICD-10-CM | POA: Diagnosis not present

## 2017-04-18 DIAGNOSIS — Z8679 Personal history of other diseases of the circulatory system: Secondary | ICD-10-CM | POA: Diagnosis not present

## 2017-04-18 DIAGNOSIS — I471 Supraventricular tachycardia: Secondary | ICD-10-CM | POA: Diagnosis not present

## 2017-05-10 DIAGNOSIS — R000 Tachycardia, unspecified: Secondary | ICD-10-CM | POA: Diagnosis not present

## 2017-05-24 DIAGNOSIS — Z8679 Personal history of other diseases of the circulatory system: Secondary | ICD-10-CM | POA: Diagnosis not present

## 2017-05-24 DIAGNOSIS — Z9889 Other specified postprocedural states: Secondary | ICD-10-CM | POA: Diagnosis not present

## 2017-05-24 DIAGNOSIS — I471 Supraventricular tachycardia: Secondary | ICD-10-CM | POA: Diagnosis not present

## 2017-06-20 DIAGNOSIS — M359 Systemic involvement of connective tissue, unspecified: Secondary | ICD-10-CM | POA: Diagnosis not present

## 2017-06-20 DIAGNOSIS — Z79899 Other long term (current) drug therapy: Secondary | ICD-10-CM | POA: Diagnosis not present

## 2017-06-20 DIAGNOSIS — D8989 Other specified disorders involving the immune mechanism, not elsewhere classified: Secondary | ICD-10-CM | POA: Diagnosis not present

## 2017-06-28 ENCOUNTER — Other Ambulatory Visit: Payer: Self-pay | Admitting: Family Medicine

## 2017-06-28 DIAGNOSIS — Z1231 Encounter for screening mammogram for malignant neoplasm of breast: Secondary | ICD-10-CM

## 2017-07-04 ENCOUNTER — Other Ambulatory Visit: Payer: Self-pay | Admitting: Family Medicine

## 2017-07-04 DIAGNOSIS — I10 Essential (primary) hypertension: Secondary | ICD-10-CM

## 2017-07-05 ENCOUNTER — Other Ambulatory Visit: Payer: Self-pay

## 2017-07-05 ENCOUNTER — Ambulatory Visit (INDEPENDENT_AMBULATORY_CARE_PROVIDER_SITE_OTHER): Payer: BLUE CROSS/BLUE SHIELD

## 2017-07-05 DIAGNOSIS — Z1231 Encounter for screening mammogram for malignant neoplasm of breast: Secondary | ICD-10-CM

## 2017-07-05 MED ORDER — METOPROLOL TARTRATE 50 MG PO TABS: ORAL_TABLET | ORAL | 0 refills | Status: DC

## 2017-07-09 ENCOUNTER — Encounter: Payer: BLUE CROSS/BLUE SHIELD | Admitting: Family Medicine

## 2017-07-10 ENCOUNTER — Encounter: Payer: BLUE CROSS/BLUE SHIELD | Admitting: Family Medicine

## 2017-07-16 ENCOUNTER — Other Ambulatory Visit: Payer: Self-pay | Admitting: Family Medicine

## 2017-07-18 ENCOUNTER — Ambulatory Visit (INDEPENDENT_AMBULATORY_CARE_PROVIDER_SITE_OTHER): Payer: BLUE CROSS/BLUE SHIELD | Admitting: Family Medicine

## 2017-07-18 ENCOUNTER — Encounter: Payer: Self-pay | Admitting: Family Medicine

## 2017-07-18 VITALS — BP 138/69 | HR 71 | Ht 66.0 in | Wt 135.0 lb

## 2017-07-18 DIAGNOSIS — Z0000 Encounter for general adult medical examination without abnormal findings: Secondary | ICD-10-CM | POA: Diagnosis not present

## 2017-07-18 LAB — COMPLETE METABOLIC PANEL WITH GFR
AG Ratio: 1.3 (calc) (ref 1.0–2.5)
ALBUMIN MSPROF: 3.9 g/dL (ref 3.6–5.1)
ALKALINE PHOSPHATASE (APISO): 60 U/L (ref 33–130)
ALT: 9 U/L (ref 6–29)
AST: 16 U/L (ref 10–35)
BILIRUBIN TOTAL: 0.4 mg/dL (ref 0.2–1.2)
BUN: 10 mg/dL (ref 7–25)
CO2: 26 mmol/L (ref 20–32)
CREATININE: 0.87 mg/dL (ref 0.50–1.05)
Calcium: 9.3 mg/dL (ref 8.6–10.4)
Chloride: 102 mmol/L (ref 98–110)
GFR, EST AFRICAN AMERICAN: 87 mL/min/{1.73_m2} (ref >=60)
GFR, Est Non African American: 75 mL/min/{1.73_m2} (ref >=60)
GLOBULIN: 3.1 g/dL (ref 1.9–3.7)
Glucose, Bld: 73 mg/dL (ref 65–99)
Potassium: 4.1 mmol/L (ref 3.5–5.3)
SODIUM: 136 mmol/L (ref 135–146)
TOTAL PROTEIN: 7 g/dL (ref 6.1–8.1)

## 2017-07-18 LAB — LIPID PANEL
CHOLESTEROL: 162 mg/dL (ref <200)
HDL: 80 mg/dL (ref >50)
LDL Cholesterol (Calc): 69 mg/dL (calc)
Non-HDL Cholesterol (Calc): 82 mg/dL (calc) (ref <130)
Total CHOL/HDL Ratio: 2 (calc) (ref <5.0)
Triglycerides: 44 mg/dL (ref <150)

## 2017-07-18 LAB — TSH: TSH: 1.1 m[IU]/L

## 2017-07-18 NOTE — Progress Notes (Signed)
Subjective:     Mariah Mckinney is a 57 y.o. female and is here for a comprehensive physical exam. The patient reports no problems.  Social History   Socioeconomic History  . Marital status: Married    Spouse name: Not on file  . Number of children: 1   . Years of education: Not on file  . Highest education level: Not on file  Social Needs  . Financial resource strain: Not on file  . Food insecurity - worry: Not on file  . Food insecurity - inability: Not on file  . Transportation needs - medical: Not on file  . Transportation needs - non-medical: Not on file  Occupational History  . Occupation: Stay at home mom  Tobacco Use  . Smoking status: Never Smoker  . Smokeless tobacco: Never Used  Substance and Sexual Activity  . Alcohol use: No  . Drug use: No  . Sexual activity: Yes    Partners: Male    Comment: married, 81 yr old son, walks daily.  Other Topics Concern  . Not on file  Social History Narrative   Walking for exercise.    Health Maintenance  Topic Date Due  . MAMMOGRAM  07/06/2019  . PAP SMEAR  01/13/2020  . TETANUS/TDAP  12/12/2021  . COLONOSCOPY  01/09/2024  . INFLUENZA VACCINE  Completed  . Hepatitis C Screening  Completed  . HIV Screening  Completed    The following portions of the patient's history were reviewed and updated as appropriate: allergies, current medications, past family history, past medical history, past social history, past surgical history and problem list.  Review of Systems A comprehensive review of systems was negative.   Objective:    BP 138/69   Pulse 71   Ht 5\' 6"  (1.676 m)   Wt 135 lb (61.2 kg)   SpO2 100%   BMI 21.79 kg/m  General appearance: alert, cooperative and appears stated age Head: Normocephalic, without obvious abnormality, atraumatic Eyes: conj clear, EOMI, PEERLA Ears: normal TM's and external ear canals both ears Nose: Nares normal. Septum midline. Mucosa normal. No drainage or sinus tenderness. Throat:  lips, mucosa, and tongue normal; teeth and gums normal Neck: no adenopathy, no carotid bruit, no JVD, supple, symmetrical, trachea midline and thyroid not enlarged, symmetric, no tenderness/mass/nodules Back: symmetric, no curvature. ROM normal. No CVA tenderness. Lungs: clear to auscultation bilaterally Breasts: normal appearance, no masses or tenderness Heart: regular rate and rhythm, S1, S2 normal, no murmur, click, rub or gallop Abdomen: soft, non-tender; bowel sounds normal; no masses,  no organomegaly Extremities: extremities normal, atraumatic, no cyanosis or edema Pulses: 2+ and symmetric Skin: Skin color, texture, turgor normal. No rashes or lesions Lymph nodes: Cervical, supraclavicular, and axillary nodes normal. Neurologic: Alert and oriented X 3, normal strength and tone. Normal symmetric reflexes. Normal coordination and gait    Assessment:    Healthy female exam.      Plan:     See After Visit Summary for Counseling Recommendations   Keep up a regular exercise program and make sure you are eating a healthy diet Try to eat 4 servings of dairy a day, or if you are lactose intolerant take a calcium with vitamin D daily.  Your vaccines are up to date.

## 2017-07-18 NOTE — Patient Instructions (Addendum)

## 2017-07-19 NOTE — Progress Notes (Signed)
All labs are normal. 

## 2017-07-23 ENCOUNTER — Other Ambulatory Visit: Payer: Self-pay | Admitting: Family Medicine

## 2017-08-29 DIAGNOSIS — Z79899 Other long term (current) drug therapy: Secondary | ICD-10-CM | POA: Diagnosis not present

## 2017-10-01 DIAGNOSIS — Z79899 Other long term (current) drug therapy: Secondary | ICD-10-CM | POA: Diagnosis not present

## 2017-10-01 DIAGNOSIS — H1851 Endothelial corneal dystrophy: Secondary | ICD-10-CM | POA: Diagnosis not present

## 2017-10-01 DIAGNOSIS — M069 Rheumatoid arthritis, unspecified: Secondary | ICD-10-CM | POA: Diagnosis not present

## 2017-10-01 DIAGNOSIS — H2513 Age-related nuclear cataract, bilateral: Secondary | ICD-10-CM | POA: Diagnosis not present

## 2017-10-21 DIAGNOSIS — R42 Dizziness and giddiness: Secondary | ICD-10-CM | POA: Diagnosis not present

## 2017-10-21 DIAGNOSIS — M351 Other overlap syndromes: Secondary | ICD-10-CM | POA: Diagnosis not present

## 2017-10-21 DIAGNOSIS — R079 Chest pain, unspecified: Secondary | ICD-10-CM | POA: Diagnosis not present

## 2017-10-21 DIAGNOSIS — R002 Palpitations: Secondary | ICD-10-CM | POA: Diagnosis not present

## 2017-10-21 DIAGNOSIS — R7989 Other specified abnormal findings of blood chemistry: Secondary | ICD-10-CM | POA: Diagnosis not present

## 2017-10-21 DIAGNOSIS — R000 Tachycardia, unspecified: Secondary | ICD-10-CM | POA: Diagnosis not present

## 2017-10-21 DIAGNOSIS — R0789 Other chest pain: Secondary | ICD-10-CM | POA: Diagnosis not present

## 2017-10-21 DIAGNOSIS — I10 Essential (primary) hypertension: Secondary | ICD-10-CM | POA: Diagnosis not present

## 2017-10-21 DIAGNOSIS — M359 Systemic involvement of connective tissue, unspecified: Secondary | ICD-10-CM | POA: Diagnosis not present

## 2017-10-21 DIAGNOSIS — Z833 Family history of diabetes mellitus: Secondary | ICD-10-CM | POA: Diagnosis not present

## 2017-10-21 DIAGNOSIS — R001 Bradycardia, unspecified: Secondary | ICD-10-CM | POA: Diagnosis not present

## 2017-10-21 DIAGNOSIS — M1990 Unspecified osteoarthritis, unspecified site: Secondary | ICD-10-CM | POA: Diagnosis not present

## 2017-10-21 DIAGNOSIS — Z809 Family history of malignant neoplasm, unspecified: Secondary | ICD-10-CM | POA: Diagnosis not present

## 2017-10-21 DIAGNOSIS — E785 Hyperlipidemia, unspecified: Secondary | ICD-10-CM | POA: Diagnosis not present

## 2017-10-21 DIAGNOSIS — I471 Supraventricular tachycardia: Secondary | ICD-10-CM | POA: Diagnosis not present

## 2017-10-21 DIAGNOSIS — Z7982 Long term (current) use of aspirin: Secondary | ICD-10-CM | POA: Diagnosis not present

## 2017-10-22 DIAGNOSIS — I471 Supraventricular tachycardia: Secondary | ICD-10-CM | POA: Diagnosis not present

## 2017-10-22 DIAGNOSIS — M359 Systemic involvement of connective tissue, unspecified: Secondary | ICD-10-CM | POA: Diagnosis not present

## 2017-10-22 DIAGNOSIS — I10 Essential (primary) hypertension: Secondary | ICD-10-CM | POA: Diagnosis not present

## 2017-10-22 DIAGNOSIS — E785 Hyperlipidemia, unspecified: Secondary | ICD-10-CM | POA: Diagnosis not present

## 2017-10-23 DIAGNOSIS — I471 Supraventricular tachycardia: Secondary | ICD-10-CM | POA: Diagnosis not present

## 2017-11-16 DIAGNOSIS — I471 Supraventricular tachycardia: Secondary | ICD-10-CM | POA: Diagnosis not present

## 2017-11-16 DIAGNOSIS — I4510 Unspecified right bundle-branch block: Secondary | ICD-10-CM | POA: Diagnosis not present

## 2017-12-20 DIAGNOSIS — I471 Supraventricular tachycardia: Secondary | ICD-10-CM | POA: Diagnosis not present

## 2017-12-20 DIAGNOSIS — Z9889 Other specified postprocedural states: Secondary | ICD-10-CM | POA: Diagnosis not present

## 2017-12-20 DIAGNOSIS — Z8679 Personal history of other diseases of the circulatory system: Secondary | ICD-10-CM | POA: Diagnosis not present

## 2017-12-21 ENCOUNTER — Other Ambulatory Visit: Payer: Self-pay | Admitting: Family Medicine

## 2018-01-15 ENCOUNTER — Encounter: Payer: Self-pay | Admitting: Sports Medicine

## 2018-01-15 ENCOUNTER — Ambulatory Visit: Payer: BLUE CROSS/BLUE SHIELD | Admitting: Sports Medicine

## 2018-01-15 DIAGNOSIS — N898 Other specified noninflammatory disorders of vagina: Secondary | ICD-10-CM | POA: Diagnosis not present

## 2018-01-15 NOTE — Assessment & Plan Note (Signed)
Unclear etiology, no urinary or GI symptoms. Wet prep and pelvic exam done today. Treatment will depend on results.

## 2018-01-15 NOTE — Progress Notes (Signed)
Subjective:    CC: Vaginal discharge  HPI: This is a pleasant 57 year old female, her and her husband were intermittent several days ago, a few days later she developed an irritating but not itchy whitish vaginal discharge.  No cramping, pelvic pain, no nausea, vomiting, diarrhea, no dysuria, flank pain.  I reviewed the past medical history, family history, social history, surgical history, and allergies today and no changes were needed.  Please see the problem list section below in epic for further details.  Past Medical History: Past Medical History:  Diagnosis Date  . Connective tissue disease ([place])   . Hypertension   . Raynaud's disease    Past Surgical History: Past Surgical History:  Procedure Laterality Date  . CARDIAC ELECTROPHYSIOLOGY MAPPING AND ABLATION    . HERNIA REPAIR  06-2005   Social History: Social History   Socioeconomic History  . Marital status: Married    Spouse name: Not on file  . Number of children: 1   . Years of education: Not on file  . Highest education level: Not on file  Occupational History  . Occupation: Stay at [employer]  . Financial resource strain: Not on file  . Food insecurity:    Worry: Not on file    Inability: Not on file  . Transportation needs:    Medical: Not on file    Non-medical: Not on file  Tobacco Use  . Smoking status: Never Smoker  . Smokeless tobacco: Never Used  Substance and Sexual Activity  . Alcohol use: No  . Drug use: No  . Sexual activity: Yes    Partners: Male    Comment: married, 70 yr old son, walks daily.  Lifestyle  . Physical activity:    Days per week: Not on file    Minutes per session: Not on file  . Stress: Not on file  Relationships  . Social connections:    Talks on phone: Not on file    Gets together: Not on file    Attends religious service: Not on file    Active member of club or organization: Not on file    Attends meetings of clubs or organizations: Not on file   Relationship status: Not on file  Other Topics Concern  . Not on file  Social History Narrative   Walking for exercise.    Family History: Family History  Problem Relation Age of Onset  . Diabetes Mother   . Hypertension Father   . Diabetes Father   . Prostate cancer Father    Allergies: No Known Allergies Medications: See med rec.  Review of Systems: No fevers, chills, night sweats, weight loss, chest pain, or shortness of breath.   Objective:    General: Well Developed, well nourished, and in no acute distress.  Neuro: Alert and oriented x3, extra-ocular muscles intact, sensation grossly intact.  HEENT: Normocephalic, atraumatic, pupils equal round reactive to light, neck supple, no masses, no lymphadenopathy, thyroid nonpalpable.  Skin: Warm and dry, no rashes. Cardiac: Regular rate and rhythm, no murmurs rubs or gallops, no lower extremity edema.  Respiratory: Clear to auscultation bilaterally. Not using accessory muscles, speaking in full sentences. Genital: Normal vulva, normal vault, normal-appearing cervix.  Mild appearing clearish/cloudy discharge, no odor.  Wet prep obtained.  Impression and Recommendations:    Vaginal discharge Unclear etiology, no urinary or GI symptoms. Wet prep and pelvic exam done today. Treatment will depend on results.  ___________________________________________ Gwen Her. Dianah Field, M.D., ABFM., CAQSM. Primary  Care and [place] Instructor of [place] [facility]

## 2018-01-16 ENCOUNTER — Ambulatory Visit: Payer: BLUE CROSS/BLUE SHIELD | Admitting: Family Medicine

## 2018-01-16 LAB — WET PREP FOR TRICH, YEAST, CLUE
MICRO NUMBER:: 90929873
Specimen Quality: ADEQUATE

## 2018-01-21 DIAGNOSIS — R079 Chest pain, unspecified: Secondary | ICD-10-CM | POA: Diagnosis not present

## 2018-01-21 DIAGNOSIS — I471 Supraventricular tachycardia: Secondary | ICD-10-CM | POA: Diagnosis not present

## 2018-02-19 DIAGNOSIS — Z79899 Other long term (current) drug therapy: Secondary | ICD-10-CM | POA: Diagnosis not present

## 2018-02-19 DIAGNOSIS — M359 Systemic involvement of connective tissue, unspecified: Secondary | ICD-10-CM | POA: Diagnosis not present

## 2018-02-20 ENCOUNTER — Other Ambulatory Visit: Payer: Self-pay | Admitting: Family Medicine

## 2018-05-01 DIAGNOSIS — I4510 Unspecified right bundle-branch block: Secondary | ICD-10-CM | POA: Diagnosis not present

## 2018-05-01 DIAGNOSIS — Z79899 Other long term (current) drug therapy: Secondary | ICD-10-CM | POA: Diagnosis not present

## 2018-05-01 DIAGNOSIS — I4519 Other right bundle-branch block: Secondary | ICD-10-CM | POA: Diagnosis not present

## 2018-05-01 DIAGNOSIS — I471 Supraventricular tachycardia: Secondary | ICD-10-CM | POA: Diagnosis not present

## 2018-05-01 DIAGNOSIS — R9431 Abnormal electrocardiogram [ECG] [EKG]: Secondary | ICD-10-CM | POA: Diagnosis not present

## 2018-07-20 ENCOUNTER — Other Ambulatory Visit: Payer: Self-pay | Admitting: Family Medicine

## 2018-07-22 NOTE — Telephone Encounter (Signed)
Needs follow up and labs- KG LPN

## 2018-07-31 ENCOUNTER — Other Ambulatory Visit: Payer: Self-pay | Admitting: Family Medicine

## 2018-07-31 DIAGNOSIS — I10 Essential (primary) hypertension: Secondary | ICD-10-CM

## 2018-08-01 ENCOUNTER — Other Ambulatory Visit: Payer: Self-pay | Admitting: Family Medicine

## 2018-08-06 ENCOUNTER — Other Ambulatory Visit: Payer: Self-pay

## 2018-08-06 DIAGNOSIS — I10 Essential (primary) hypertension: Secondary | ICD-10-CM

## 2018-08-06 MED ORDER — HYDROCHLOROTHIAZIDE 12.5 MG PO TABS: 12.5000 mg | ORAL_TABLET | Freq: Every day | ORAL | 0 refills | Status: DC

## 2018-08-13 ENCOUNTER — Ambulatory Visit: Payer: BLUE CROSS/BLUE SHIELD | Admitting: Family Medicine

## 2018-08-14 ENCOUNTER — Ambulatory Visit (INDEPENDENT_AMBULATORY_CARE_PROVIDER_SITE_OTHER): Payer: BLUE CROSS/BLUE SHIELD | Admitting: Family Medicine

## 2018-08-14 ENCOUNTER — Encounter: Payer: Self-pay | Admitting: Family Medicine

## 2018-08-14 VITALS — BP 127/63 | HR 64 | Ht 66.0 in | Wt 140.0 lb

## 2018-08-14 DIAGNOSIS — Z0000 Encounter for general adult medical examination without abnormal findings: Secondary | ICD-10-CM

## 2018-08-14 MED ORDER — ATORVASTATIN CALCIUM 20 MG PO TABS: 20.0000 mg | ORAL_TABLET | Freq: Every day | ORAL | 3 refills | Status: DC

## 2018-08-14 NOTE — Progress Notes (Signed)
Subjective:     Mariah Mckinney is a 58 y.o. female and is here for a comprehensive physical exam. The patient reports no problems.  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  She is been a little bit stressed and nervous but excited at the same time.  He is not currently exercising but plans to get back into it.  Overall feels like her diet is healthy.   Social History   Socioeconomic History  . Marital status: Married    Spouse name: Not on file  . Number of children: 1   . Years of education: Not on file  . Highest education level: Not on file  Occupational History  . Occupation: Stay at [employer]  . Financial resource strain: Not on file  . Food insecurity:    Worry: Not on file    Inability: Not on file  . Transportation needs:    Medical: Not on file    Non-medical: Not on file  Tobacco Use  . Smoking status: Never Smoker  . Smokeless tobacco: Never Used  Substance and Sexual Activity  . Alcohol use: No  . Drug use: No  . Sexual activity: Yes    Partners: Male    Comment: married, 25 yr old son, walks daily.  Lifestyle  . Physical activity:    Days per week: Not on file    Minutes per session: Not on file  . Stress: Not on file  Relationships  . Social connections:    Talks on phone: Not on file    Gets together: Not on file    Attends religious service: Not on file    Active member of club or organization: Not on file    Attends meetings of clubs or organizations: Not on file    Relationship status: Not on file  . Intimate partner violence:    Fear of current or ex partner: Not on file    Emotionally abused: Not on file    Physically abused: Not on file    Forced sexual activity: Not on file  Other Topics Concern  . Not on file  Social History Narrative   Walking for exercise.    Health Maintenance  Topic Date Due  . MAMMOGRAM  07/06/2019  . PAP SMEAR-Modifier  01/13/2020  . TETANUS/TDAP  12/12/2021  .  COLONOSCOPY  01/09/2024  . INFLUENZA VACCINE  Completed  . Hepatitis C Screening  Completed  . HIV Screening  Completed    The following portions of the patient's history were reviewed and updated as appropriate: allergies, current medications, past family history, past medical history, past social history, past surgical history and problem list.  Review of Systems A comprehensive review of systems was negative.   Objective:    BP 127/63   Pulse 64   Ht 5\' 6"  (1.676 m)   Wt 140 lb (63.5 kg)   SpO2 100%   BMI 22.60 kg/m  General appearance: alert, cooperative and appears stated age Head: Normocephalic, without obvious abnormality, atraumatic Eyes: conj clear, PEERLA, conj clear, Ears: normal TM's and external ear canals both ears Nose: Nares normal. Septum midline. Mucosa normal. No drainage or sinus tenderness. Throat: lips, mucosa, and tongue normal; teeth and gums normal Neck: no adenopathy, no carotid bruit, no JVD, supple, symmetrical, trachea midline and thyroid not enlarged, symmetric, no tenderness/mass/nodules Back: symmetric, no curvature. ROM normal. No CVA tenderness. Lungs: clear to auscultation bilaterally Breasts: normal appearance,  no masses or tenderness Heart: regular rate and rhythm, S1, S2 normal, no murmur, click, rub or gallop Abdomen: soft, non-tender; bowel sounds normal; no masses,  no organomegaly Extremities: extremities normal, atraumatic, no cyanosis or edema Pulses: 2+ and symmetric Skin: Skin color, texture, turgor normal. No rashes or lesions Lymph nodes: Cervical, supraclavicular, and axillary nodes normal. Neurologic: Alert and oriented X 3, normal strength and tone. Normal symmetric reflexes. Normal coordination and gait    Assessment:    Healthy female exam.     Plan:     See After Visit Summary for Counseling Recommendations   Keep up a regular exercise program and make sure you are eating a healthy diet Try to eat 4 servings of dairy  a day, or if you are lactose intolerant take a calcium with vitamin D daily.  Your vaccines are up to date.

## 2018-08-14 NOTE — Patient Instructions (Signed)

## 2018-08-15 ENCOUNTER — Encounter: Payer: Self-pay | Admitting: Family Medicine

## 2018-08-15 DIAGNOSIS — Z0000 Encounter for general adult medical examination without abnormal findings: Secondary | ICD-10-CM | POA: Diagnosis not present

## 2018-08-16 ENCOUNTER — Other Ambulatory Visit: Payer: Self-pay | Admitting: *Deleted

## 2018-08-16 DIAGNOSIS — E871 Hypo-osmolality and hyponatremia: Secondary | ICD-10-CM

## 2018-08-16 LAB — LIPID PANEL
Cholesterol: 152 mg/dL (ref <200)
HDL: 70 mg/dL (ref >=50)
LDL Cholesterol (Calc): 70 mg/dL (calc)
Non-HDL Cholesterol (Calc): 82 mg/dL (calc) (ref <130)
Total CHOL/HDL Ratio: 2.2 (calc) (ref <5.0)
Triglycerides: 50 mg/dL (ref <150)

## 2018-08-16 LAB — COMPLETE METABOLIC PANEL WITH GFR
AG Ratio: 1.3 (calc) (ref 1.0–2.5)
ALBUMIN MSPROF: 4.1 g/dL (ref 3.6–5.1)
ALT: 9 U/L (ref 6–29)
AST: 16 U/L (ref 10–35)
Alkaline phosphatase (APISO): 75 U/L (ref 37–153)
BUN: 9 mg/dL (ref 7–25)
CALCIUM: 9.3 mg/dL (ref 8.6–10.4)
CO2: 25 mmol/L (ref 20–32)
Chloride: 97 mmol/L — ABNORMAL LOW (ref 98–110)
Creat: 1.02 mg/dL (ref 0.50–1.05)
GFR, EST AFRICAN AMERICAN: 71 mL/min/{1.73_m2} (ref >=60)
GFR, EST NON AFRICAN AMERICAN: 61 mL/min/{1.73_m2} (ref >=60)
Globulin: 3.1 g/dL (calc) (ref 1.9–3.7)
Glucose, Bld: 74 mg/dL (ref 65–99)
Potassium: 4.5 mmol/L (ref 3.5–5.3)
Sodium: 132 mmol/L — ABNORMAL LOW (ref 135–146)
Total Bilirubin: 0.6 mg/dL (ref 0.2–1.2)
Total Protein: 7.2 g/dL (ref 6.1–8.1)

## 2018-08-16 LAB — VITAMIN D 25 HYDROXY (VIT D DEFICIENCY, FRACTURES): VIT D 25 HYDROXY: 15 ng/mL — AB (ref 30–100)

## 2018-08-16 LAB — CBC
HCT: 37.1 % (ref 35.0–45.0)
Hemoglobin: 12.5 g/dL (ref 11.7–15.5)
MCH: 30.4 pg (ref 27.0–33.0)
MCHC: 33.7 g/dL (ref 32.0–36.0)
MCV: 90.3 fL (ref 80.0–100.0)
MPV: 11.4 fL (ref 7.5–12.5)
Platelets: 225 10*3/uL (ref 140–400)
RBC: 4.11 10*6/uL (ref 3.80–5.10)
RDW: 12.4 % (ref 11.0–15.0)
WBC: 3 10*3/uL — ABNORMAL LOW (ref 3.8–10.8)

## 2018-08-16 LAB — TSH: TSH: 1.92 mIU/L (ref 0.40–4.50)

## 2018-08-20 DIAGNOSIS — M359 Systemic involvement of connective tissue, unspecified: Secondary | ICD-10-CM | POA: Diagnosis not present

## 2018-08-20 DIAGNOSIS — Z79899 Other long term (current) drug therapy: Secondary | ICD-10-CM | POA: Diagnosis not present

## 2018-08-20 DIAGNOSIS — R682 Dry mouth, unspecified: Secondary | ICD-10-CM | POA: Diagnosis not present

## 2018-08-22 DIAGNOSIS — Z79899 Other long term (current) drug therapy: Secondary | ICD-10-CM | POA: Diagnosis not present

## 2018-09-09 DIAGNOSIS — E871 Hypo-osmolality and hyponatremia: Secondary | ICD-10-CM | POA: Diagnosis not present

## 2018-09-09 LAB — SODIUM: Sodium: 134 mmol/L — ABNORMAL LOW (ref 135–146)

## 2018-09-11 NOTE — Addendum Note (Signed)
Addended by: Teddy Spike on: 09/11/2018 12:57 PM   Modules accepted: Orders

## 2018-10-07 DIAGNOSIS — Z79899 Other long term (current) drug therapy: Secondary | ICD-10-CM | POA: Diagnosis not present

## 2018-10-07 DIAGNOSIS — M069 Rheumatoid arthritis, unspecified: Secondary | ICD-10-CM | POA: Diagnosis not present

## 2018-10-07 DIAGNOSIS — H2513 Age-related nuclear cataract, bilateral: Secondary | ICD-10-CM | POA: Diagnosis not present

## 2018-10-07 DIAGNOSIS — H1851 Endothelial corneal dystrophy: Secondary | ICD-10-CM | POA: Diagnosis not present

## 2018-10-29 ENCOUNTER — Other Ambulatory Visit: Payer: Self-pay | Admitting: Family Medicine

## 2018-10-29 DIAGNOSIS — I10 Essential (primary) hypertension: Secondary | ICD-10-CM

## 2018-10-30 DIAGNOSIS — Z9889 Other specified postprocedural states: Secondary | ICD-10-CM | POA: Diagnosis not present

## 2018-10-30 DIAGNOSIS — I471 Supraventricular tachycardia: Secondary | ICD-10-CM | POA: Diagnosis not present

## 2018-10-30 DIAGNOSIS — I440 Atrioventricular block, first degree: Secondary | ICD-10-CM | POA: Diagnosis not present

## 2018-10-30 DIAGNOSIS — Z79899 Other long term (current) drug therapy: Secondary | ICD-10-CM | POA: Diagnosis not present

## 2018-10-30 DIAGNOSIS — M358 Other specified systemic involvement of connective tissue: Secondary | ICD-10-CM | POA: Diagnosis not present

## 2018-10-30 DIAGNOSIS — I10 Essential (primary) hypertension: Secondary | ICD-10-CM | POA: Diagnosis not present

## 2018-12-03 ENCOUNTER — Other Ambulatory Visit: Payer: Self-pay | Admitting: Family Medicine

## 2019-01-13 ENCOUNTER — Other Ambulatory Visit: Payer: Self-pay | Admitting: Family Medicine

## 2019-01-21 ENCOUNTER — Other Ambulatory Visit: Payer: Self-pay | Admitting: Family Medicine

## 2019-01-21 DIAGNOSIS — I10 Essential (primary) hypertension: Secondary | ICD-10-CM

## 2019-03-11 ENCOUNTER — Other Ambulatory Visit: Payer: Self-pay | Admitting: Family Medicine

## 2019-03-11 DIAGNOSIS — Z79899 Other long term (current) drug therapy: Secondary | ICD-10-CM | POA: Diagnosis not present

## 2019-03-11 DIAGNOSIS — M359 Systemic involvement of connective tissue, unspecified: Secondary | ICD-10-CM | POA: Diagnosis not present

## 2019-03-11 DIAGNOSIS — I10 Essential (primary) hypertension: Secondary | ICD-10-CM

## 2019-03-14 ENCOUNTER — Other Ambulatory Visit: Payer: Self-pay | Admitting: Family Medicine

## 2019-04-03 ENCOUNTER — Other Ambulatory Visit: Payer: Self-pay | Admitting: Family Medicine

## 2019-04-03 DIAGNOSIS — Z1231 Encounter for screening mammogram for malignant neoplasm of breast: Secondary | ICD-10-CM

## 2019-04-30 ENCOUNTER — Other Ambulatory Visit: Payer: Self-pay

## 2019-04-30 ENCOUNTER — Ambulatory Visit (INDEPENDENT_AMBULATORY_CARE_PROVIDER_SITE_OTHER): Payer: BC Managed Care – PPO

## 2019-04-30 DIAGNOSIS — Z1231 Encounter for screening mammogram for malignant neoplasm of breast: Secondary | ICD-10-CM

## 2019-05-21 DIAGNOSIS — I471 Supraventricular tachycardia: Secondary | ICD-10-CM | POA: Diagnosis not present

## 2019-08-21 ENCOUNTER — Other Ambulatory Visit: Payer: Self-pay | Admitting: Family Medicine

## 2019-08-21 DIAGNOSIS — I10 Essential (primary) hypertension: Secondary | ICD-10-CM

## 2019-08-22 NOTE — Telephone Encounter (Signed)
Must make appointment 

## 2019-09-01 ENCOUNTER — Encounter: Payer: Self-pay | Admitting: Family Medicine

## 2019-09-01 ENCOUNTER — Ambulatory Visit (INDEPENDENT_AMBULATORY_CARE_PROVIDER_SITE_OTHER): Payer: BC Managed Care – PPO | Admitting: Family Medicine

## 2019-09-01 ENCOUNTER — Other Ambulatory Visit (HOSPITAL_COMMUNITY)
Admission: RE | Admit: 2019-09-01 | Discharge: 2019-09-01 | Disposition: A | Discharge status: Home / Self Care | Payer: BC Managed Care – PPO | Source: Ambulatory Visit | Attending: Family Medicine | Admitting: Family Medicine

## 2019-09-01 ENCOUNTER — Other Ambulatory Visit: Payer: Self-pay

## 2019-09-01 VITALS — BP 144/56 | HR 65 | Ht 66.0 in | Wt 141.0 lb

## 2019-09-01 DIAGNOSIS — Z0000 Encounter for general adult medical examination without abnormal findings: Secondary | ICD-10-CM | POA: Insufficient documentation

## 2019-09-01 DIAGNOSIS — Z124 Encounter for screening for malignant neoplasm of cervix: Secondary | ICD-10-CM | POA: Insufficient documentation

## 2019-09-01 DIAGNOSIS — I10 Essential (primary) hypertension: Secondary | ICD-10-CM

## 2019-09-01 DIAGNOSIS — L720 Epidermal cyst: Secondary | ICD-10-CM | POA: Diagnosis not present

## 2019-09-01 MED ORDER — HYDROCHLOROTHIAZIDE 12.5 MG PO TABS: 12.5000 mg | ORAL_TABLET | Freq: Every day | ORAL | 0 refills | Status: DC

## 2019-09-01 NOTE — Progress Notes (Signed)
Subjective:     Mariah Mckinney is a 59 y.o. female and is here for a comprehensive physical exam. The patient reports no problems.  She is now 75 and has been on Xulane normal patch.  She says once in a while she will still bleed.  She wants to know when she could come off and when she is officially menopausal.  She does have a mole on her right side under the axilla that she would like me to take a look at today.  She walks for exercise.   He denies any chest pain, shortness of breath or palpitations.  Social History   Socioeconomic History  . Marital status: Married    Spouse name: Not on file  . Number of children: 1   . Years of education: Not on file  . Highest education level: Not on file  Occupational History  . Occupation: Stay at home mom  Tobacco Use  . Smoking status: Never Smoker  . Smokeless tobacco: Never Used  Substance and Sexual Activity  . Alcohol use: No  . Drug use: No  . Sexual activity: Yes    Partners: Male    Comment: married, 11 yr old son, walks daily.  Other Topics Concern  . Not on file  Social History Narrative   Walking for exercise.    Social Determinants of Health   Financial Resource Strain:   . Difficulty of Paying Living Expenses:   Food Insecurity:   . Worried About Charity fundraiser in the Last Year:   . Arboriculturist in the Last Year:   Transportation Needs:   . Film/video editor (Medical):   Marland Kitchen Lack of Transportation (Non-Medical):   Physical Activity:   . Days of Exercise per Week:   . Minutes of Exercise per Session:   Stress:   . Feeling of Stress :   Social Connections:   . Frequency of Communication with Friends and Family:   . Frequency of Social Gatherings with Friends and Family:   . Attends Religious Services:   . Active Member of Clubs or Organizations:   . Attends Archivist Meetings:   Marland Kitchen Marital Status:   Intimate Partner Violence:   . Fear of Current or Ex-Partner:   . Emotionally Abused:    Marland Kitchen Physically Abused:   . Sexually Abused:    Health Maintenance  Topic Date Due  . PAP SMEAR-Modifier  01/13/2020  . MAMMOGRAM  04/29/2021  . TETANUS/TDAP  12/12/2021  . COLONOSCOPY  01/09/2024  . INFLUENZA VACCINE  Completed  . Hepatitis C Screening  Completed  . HIV Screening  Completed    The following portions of the patient's history were reviewed and updated as appropriate: allergies, current medications, past family history, past medical history, past social history, past surgical history and problem list.  Review of Systems A comprehensive review of systems was negative.   Objective:    BP (!) 144/56   Pulse 65   Ht 5\' 6"  (1.676 m)   Wt 141 lb (64 kg)   SpO2 100%   BMI 22.76 kg/m  General appearance: alert, cooperative and appears stated age Head: Normocephalic, without obvious abnormality, atraumatic Eyes: conj clear, EOMI, PEERLA Ears: normal TM's and external ear canals both ears Nose: Nares normal. Septum midline. Mucosa normal. No drainage or sinus tenderness. Throat: lips, mucosa, and tongue normal; teeth and gums normal Neck: no adenopathy, no carotid bruit, no JVD, supple, symmetrical, trachea midline and  thyroid not enlarged, symmetric, no tenderness/mass/nodules Back: symmetric, no curvature. ROM normal. No CVA tenderness. Lungs: clear to auscultation bilaterally Breasts: normal appearance, no masses or tenderness Heart: regular rate and rhythm, S1, S2 normal, no murmur, click, rub or gallop Abdomen: soft, non-tender; bowel sounds normal; no masses,  no organomegaly Pelvic: cervix normal in appearance, external genitalia normal, no adnexal masses or tenderness, no cervical motion tenderness, rectovaginal septum normal, uterus normal size, shape, and consistency and vagina normal without discharge Extremities: extremities normal, atraumatic, no cyanosis or edema Pulses: 2+ and symmetric Skin: Skin color, texture, turgor normal. No rashes or lesions.  She  has an epidermal cyst over the right side under the axilla.  It measures approximately 1 cm Lymph nodes: Cervical, supraclavicular, and axillary nodes normal. Neurologic: Alert and oriented X 3, normal strength and tone. Normal symmetric reflexes. Normal coordination and gait    Assessment:    Healthy female exam.      Plan:     See After Visit Summary for Counseling Recommendations   Keep up a regular exercise program and make sure you are eating a healthy diet Try to eat 4 servings of dairy a day, or if you are lactose intolerant take a calcium with vitamin D daily.  Your vaccines are up to date.   We discussed discontinuing the Xulane.  When she is been off for months she can go to the lab and we can test for hormone she can use a backup method and between.  Epidermal cyst on her right side.  Discussed benign nature of these lesions that they can be removed by excision.  Hypertension-repeat blood pressure still elevated.  Reports she is taking her blood pressure medications regularly.  I did go ahead and refill her hydrochlorothiazide today but she should still have refills on metoprolol.  Like to have her come back in a few weeks to recheck if it still elevated then we do have room to increase her dose on either medication.

## 2019-09-01 NOTE — Patient Instructions (Signed)
Health Maintenance, Female Adopting a healthy lifestyle and getting preventive care are important in promoting health and wellness. Ask your health care provider about:  The right schedule for you to have regular tests and exams.  Things you can do on your own to prevent diseases and keep yourself healthy. What should I know about diet, weight, and exercise? Eat a healthy diet   Eat a diet that includes plenty of vegetables, fruits, low-fat dairy products, and lean protein.  Do not eat a lot of foods that are high in solid fats, added sugars, or sodium. Maintain a healthy weight Body mass index (BMI) is used to identify weight problems. It estimates body fat based on height and weight. Your health care provider can help determine your BMI and help you achieve or maintain a healthy weight. Get regular exercise Get regular exercise. This is one of the most important things you can do for your health. Most adults should:  Exercise for at least 150 minutes each week. The exercise should increase your heart rate and make you sweat (moderate-intensity exercise).  Do strengthening exercises at least twice a week. This is in addition to the moderate-intensity exercise.  Spend less time sitting. Even light physical activity can be beneficial. Watch cholesterol and blood lipids Have your blood tested for lipids and cholesterol at 57 years of age, then have this test every 5 years. Have your cholesterol levels checked more often if:  Your lipid or cholesterol levels are high.  You are older than 57 years of age.  You are at high risk for heart disease. What should I know about cancer screening? Depending on your health history and family history, you may need to have cancer screening at various ages. This may include screening for:  Breast cancer.  Cervical cancer.  Colorectal cancer.  Skin cancer.  Lung cancer. What should I know about heart disease, diabetes, and high blood  pressure? Blood pressure and heart disease  High blood pressure causes heart disease and increases the risk of stroke. This is more likely to develop in people who have high blood pressure readings, are of African descent, or are overweight.  Have your blood pressure checked: ? Every 3-5 years if you are 18-39 years of age. ? Every year if you are 40 years old or older. Diabetes Have regular diabetes screenings. This checks your fasting blood sugar level. Have the screening done:  Once every three years after age 40 if you are at a normal weight and have a low risk for diabetes.  More often and at a younger age if you are overweight or have a high risk for diabetes. What should I know about preventing infection? Hepatitis B If you have a higher risk for hepatitis B, you should be screened for this virus. Talk with your health care provider to find out if you are at risk for hepatitis B infection. Hepatitis C Testing is recommended for:  Everyone born from 1945 through 1965.  Anyone with known risk factors for hepatitis C. Sexually transmitted infections (STIs)  Get screened for STIs, including gonorrhea and chlamydia, if: ? You are sexually active and are younger than 57 years of age. ? You are older than 57 years of age and your health care provider tells you that you are at risk for this type of infection. ? Your sexual activity has changed since you were last screened, and you are at increased risk for chlamydia or gonorrhea. Ask your health care provider if   you are at risk.  Ask your health care provider about whether you are at high risk for HIV. Your health care provider may recommend a prescription medicine to help prevent HIV infection. If you choose to take medicine to prevent HIV, you should first get tested for HIV. You should then be tested every 3 months for as long as you are taking the medicine. Pregnancy  If you are about to stop having your period (premenopausal) and  you may become pregnant, seek counseling before you get pregnant.  Take 400 to 800 micrograms (mcg) of folic acid every day if you become pregnant.  Ask for birth control (contraception) if you want to prevent pregnancy. Osteoporosis and menopause Osteoporosis is a disease in which the bones lose minerals and strength with aging. This can result in bone fractures. If you are 65 years old or older, or if you are at risk for osteoporosis and fractures, ask your health care provider if you should:  Be screened for bone loss.  Take a calcium or vitamin D supplement to lower your risk of fractures.  Be given hormone replacement therapy (HRT) to treat symptoms of menopause. Follow these instructions at home: Lifestyle  Do not use any products that contain nicotine or tobacco, such as cigarettes, e-cigarettes, and chewing tobacco. If you need help quitting, ask your health care provider.  Do not use street drugs.  Do not share needles.  Ask your health care provider for help if you need support or information about quitting drugs. Alcohol use  Do not drink alcohol if: ? Your health care provider tells you not to drink. ? You are pregnant, may be pregnant, or are planning to become pregnant.  If you drink alcohol: ? Limit how much you use to 0-1 drink a day. ? Limit intake if you are breastfeeding.  Be aware of how much alcohol is in your drink. In the [place] one drink equals one 12 oz bottle of beer (355 mL), one 5 oz glass of wine (148 mL), or one 1 oz glass of hard liquor (44 mL). General instructions  Schedule regular health, dental, and eye exams.  Stay current with your vaccines.  Tell your health care provider if: ? You often feel depressed. ? You have ever been abused or do not feel safe at home. Summary  Adopting a healthy lifestyle and getting preventive care are important in promoting health and wellness.  Follow your health care provider's instructions about healthy  diet, exercising, and getting tested or screened for diseases.  Follow your health care provider's instructions on monitoring your cholesterol and blood pressure. This information is not intended to replace advice given to you by your health care provider. Make sure you discuss any questions you have with your health care provider. Document Revised: 05/22/2018 Document Reviewed: 05/22/2018 Elsevier Patient Education  2020 Elsevier Inc.  

## 2019-09-02 DIAGNOSIS — Z79899 Other long term (current) drug therapy: Secondary | ICD-10-CM | POA: Diagnosis not present

## 2019-09-02 DIAGNOSIS — R002 Palpitations: Secondary | ICD-10-CM | POA: Diagnosis not present

## 2019-09-02 DIAGNOSIS — Z0000 Encounter for general adult medical examination without abnormal findings: Secondary | ICD-10-CM | POA: Diagnosis not present

## 2019-09-02 LAB — CYTOLOGY - PAP
Comment: NEGATIVE
Diagnosis: NEGATIVE
High risk HPV: NEGATIVE

## 2019-09-05 LAB — COMPLETE METABOLIC PANEL WITH GFR
AG Ratio: 1.3 (calc) (ref 1.0–2.5)
ALT: 9 U/L (ref 6–29)
AST: 15 U/L (ref 10–35)
Albumin: 3.8 g/dL (ref 3.6–5.1)
Alkaline phosphatase (APISO): 62 U/L (ref 37–153)
BUN: 8 mg/dL (ref 7–25)
CO2: 27 mmol/L (ref 20–32)
Calcium: 9.2 mg/dL (ref 8.6–10.4)
Chloride: 99 mmol/L (ref 98–110)
Creat: 1.03 mg/dL (ref 0.50–1.05)
GFR, Est African American: 70 mL/min/{1.73_m2} (ref >=60)
GFR, Est Non African American: 60 mL/min/{1.73_m2} (ref >=60)
Globulin: 2.9 g/dL (calc) (ref 1.9–3.7)
Glucose, Bld: 78 mg/dL (ref 65–99)
Potassium: 4 mmol/L (ref 3.5–5.3)
Sodium: 133 mmol/L — ABNORMAL LOW (ref 135–146)
Total Bilirubin: 0.5 mg/dL (ref 0.2–1.2)
Total Protein: 6.7 g/dL (ref 6.1–8.1)

## 2019-09-05 LAB — B12 AND FOLATE PANEL
Folate: 8.9 ng/mL
Vitamin B-12: 531 pg/mL (ref 200–1100)

## 2019-09-05 LAB — LIPID PANEL
Cholesterol: 136 mg/dL (ref <200)
HDL: 56 mg/dL (ref >=50)
LDL Cholesterol (Calc): 67 mg/dL (calc)
Non-HDL Cholesterol (Calc): 80 mg/dL (calc) (ref <130)
Total CHOL/HDL Ratio: 2.4 (calc) (ref <5.0)
Triglycerides: 44 mg/dL (ref <150)

## 2019-09-05 LAB — CBC
HCT: 34.1 % — ABNORMAL LOW (ref 35.0–45.0)
Hemoglobin: 11.5 g/dL — ABNORMAL LOW (ref 11.7–15.5)
MCH: 30.4 pg (ref 27.0–33.0)
MCHC: 33.7 g/dL (ref 32.0–36.0)
MCV: 90.2 fL (ref 80.0–100.0)
MPV: 11.4 fL (ref 7.5–12.5)
Platelets: 207 10*3/uL (ref 140–400)
RBC: 3.78 10*6/uL — ABNORMAL LOW (ref 3.80–5.10)
RDW: 12.7 % (ref 11.0–15.0)
WBC: 3.8 10*3/uL (ref 3.8–10.8)

## 2019-09-05 LAB — FERRITIN: Ferritin: 56 ng/mL (ref 16–232)

## 2019-09-05 LAB — TSH: TSH: 1.44 mIU/L (ref 0.40–4.50)

## 2019-09-10 DIAGNOSIS — Z79899 Other long term (current) drug therapy: Secondary | ICD-10-CM | POA: Diagnosis not present

## 2019-09-10 DIAGNOSIS — M359 Systemic involvement of connective tissue, unspecified: Secondary | ICD-10-CM | POA: Diagnosis not present

## 2019-09-29 ENCOUNTER — Other Ambulatory Visit: Payer: Self-pay | Admitting: Family Medicine

## 2019-09-29 ENCOUNTER — Ambulatory Visit: Payer: BC Managed Care – PPO | Admitting: Family Medicine

## 2019-10-09 DIAGNOSIS — Z79899 Other long term (current) drug therapy: Secondary | ICD-10-CM | POA: Diagnosis not present

## 2019-10-14 ENCOUNTER — Other Ambulatory Visit: Payer: Self-pay | Admitting: Family Medicine

## 2019-10-14 DIAGNOSIS — I10 Essential (primary) hypertension: Secondary | ICD-10-CM

## 2019-10-21 ENCOUNTER — Ambulatory Visit: Payer: BC Managed Care – PPO | Admitting: Family Medicine

## 2019-10-21 ENCOUNTER — Other Ambulatory Visit: Payer: Self-pay

## 2019-10-21 ENCOUNTER — Encounter: Payer: Self-pay | Admitting: Family Medicine

## 2019-10-21 VITALS — BP 124/62 | HR 65 | Ht 66.0 in | Wt 138.0 lb

## 2019-10-21 DIAGNOSIS — I10 Essential (primary) hypertension: Secondary | ICD-10-CM

## 2019-10-21 DIAGNOSIS — N912 Amenorrhea, unspecified: Secondary | ICD-10-CM | POA: Diagnosis not present

## 2019-10-21 NOTE — Assessment & Plan Note (Signed)
BP looks fantastic today on current regimen.  Follow-up in 6 months.

## 2019-10-21 NOTE — Progress Notes (Signed)
Established Patient Office Visit  Subjective:  Patient ID: Mariah Mckinney, female    DOB: 1960/09/29  Age: 59 y.o. MRN: [id number]  CC:  Chief Complaint  Patient presents with  . Hypertension    HPI Mariah Mckinney presents for blood pressure.  She has been try to keep track of it at home she is checking it about once or twice a week.  Her lowest blood pressure was 111/71.  Her highest blood pressure was 178/78.  Most of her pressures were in the [id number] and [id number] systolic.  But she did have a few high ones.  I asked if there was anything unusual on those days as far as diet changes or how she felt and she said no.  Just encouraged her to continue to monitor periodically and notate if there is anything unusual on the days where time.  It may be even recheck it later in the day.  She actually recently went to rheumatology and her blood pressure look great there she is been taking her medications regularly.  She has now been off her Ortho Evra patch/Xulane for about 2 months and so would like to check her hormone levels to see if she is fully postmenopausal which I suspect she probably is  Past Medical History:  Diagnosis Date  . Connective tissue disease ([place])   . Hypertension   . Raynaud's disease     Past Surgical History:  Procedure Laterality Date  . CARDIAC ELECTROPHYSIOLOGY MAPPING AND ABLATION    . HERNIA REPAIR  06-2005    Family History  Problem Relation Age of Onset  . Diabetes Mother   . Hypertension Father   . Diabetes Father   . Prostate cancer Father     Social History   Socioeconomic History  . Marital status: Married    Spouse name: Not on file  . Number of children: 1   . Years of education: Not on file  . Highest education level: Not on file  Occupational History  . Occupation: Stay at home mom  Tobacco Use  . Smoking status: Never Smoker  . Smokeless tobacco: Never Used  Substance and Sexual Activity  . Alcohol use: No  . Drug use: No  .  Sexual activity: Yes    Partners: Male    Comment: married, 64 yr old son, walks daily.  Other Topics Concern  . Not on file  Social History Narrative   Walking for exercise.    Social Determinants of Health   Financial Resource Strain:   . Difficulty of Paying Living Expenses:   Food Insecurity:   . Worried About Charity fundraiser in the Last Year:   . Arboriculturist in the Last Year:   Transportation Needs:   . Film/video editor (Medical):   Marland Kitchen Lack of Transportation (Non-Medical):   Physical Activity:   . Days of Exercise per Week:   . Minutes of Exercise per Session:   Stress:   . Feeling of Stress :   Social Connections:   . Frequency of Communication with Friends and Family:   . Frequency of Social Gatherings with Friends and Family:   . Attends Religious Services:   . Active Member of Clubs or Organizations:   . Attends Archivist Meetings:   Marland Kitchen Marital Status:   Intimate Partner Violence:   . Fear of Current or Ex-Partner:   . Emotionally Abused:   Marland Kitchen Physically Abused:   . Sexually Abused:  Outpatient Medications Prior to Visit  Medication Sig Dispense Refill  . atorvastatin (LIPITOR) 20 MG tablet Take 1 tablet (20 mg total) by mouth daily at 12 noon. 90 tablet 3  . [facility] ASPIRIN ADULT LOW DOSE 81 MG chewable tablet TAKE 1 TABLET BY MOUTH EVERY DAY 90 tablet 3  . flecainide (TAMBOCOR) 100 MG tablet Take 1 tablet by mouth every 12 (twelve) hours.    . hydrochlorothiazide (HYDRODIURIL) 12.5 MG tablet Take 1 tablet (12.5 mg total) by mouth daily. 60 tablet 0  . hydroxychloroquine (PLAQUENIL) 200 MG tablet Take 200 mg by mouth 2 (two) times daily.     . magnesium chloride (SLOW-MAG) 64 MG TBEC SR tablet Take 71.5 mg by mouth daily.    . metoprolol tartrate (LOPRESSOR) 50 MG tablet TAKE 1 TABLET (50 MG TOTAL) BY MOUTH 2 TIMES DAILY. 180 tablet 3  . nabumetone (RELAFEN) 750 MG tablet Take 750 mg by mouth 2 (two) times daily.    Marland Kitchen VITAMIN D PO Take 1  tablet by mouth daily.     No facility-administered medications prior to visit.    No Known Allergies  ROS Review of Systems    Objective:    Physical Exam  Constitutional: She is oriented to person, place, and time. She appears well-developed and well-nourished.  HENT:  Head: Normocephalic and atraumatic.  Cardiovascular: Normal rate, regular rhythm and normal heart sounds.  Pulmonary/Chest: Effort normal and breath sounds normal.  Neurological: She is alert and oriented to person, place, and time.  Skin: Skin is warm and dry.  Psychiatric: She has a normal mood and affect. Her behavior is normal.    BP 124/62   Pulse 65   Ht 5\' 6"  (1.676 m)   Wt 138 lb (62.6 kg)   SpO2 100%   BMI 22.27 kg/m  Wt Readings from Last 3 Encounters:  10/21/19 138 lb (62.6 kg)  09/01/19 141 lb (64 kg)  08/14/18 140 lb (63.5 kg)     There are no preventive care reminders to display for this patient.  There are no preventive care reminders to display for this patient.  Lab Results  Component Value Date   TSH 1.44 09/02/2019   Lab Results  Component Value Date   WBC 3.8 09/02/2019   HGB 11.5 (L) 09/02/2019   HCT 34.1 (L) 09/02/2019   MCV 90.2 09/02/2019   PLT 207 09/02/2019   Lab Results  Component Value Date   NA 133 (L) 09/02/2019   K 4.0 09/02/2019   CO2 27 09/02/2019   GLUCOSE 78 09/02/2019   BUN 8 09/02/2019   CREATININE 1.03 09/02/2019   BILITOT 0.5 09/02/2019   ALKPHOS 63 07/10/2016   AST 15 09/02/2019   ALT 9 09/02/2019   PROT 6.7 09/02/2019   ALBUMIN 4.3 07/10/2016   CALCIUM 9.2 09/02/2019   Lab Results  Component Value Date   CHOL 136 09/02/2019   Lab Results  Component Value Date   HDL 56 09/02/2019   Lab Results  Component Value Date   LDLCALC 67 09/02/2019   Lab Results  Component Value Date   TRIG 44 09/02/2019   Lab Results  Component Value Date   CHOLHDL 2.4 09/02/2019   No results found for: HGBA1C    Assessment & Plan:   Problem  List Items Addressed This Visit      Cardiovascular and Mediastinum   Essential hypertension    BP looks fantastic today on current regimen.  Follow-up in 6 months.  Other Visit Diagnoses    Amenorrhea    -  Primary   Relevant Orders   Estradiol   Progesterone   Follicle stimulating hormone   Luteinizing hormone      Amenorrhea-off her hormone patch.  Suspect she is probably postmenopausal but will get labs to confirm.  No orders of the defined types were placed in this encounter.   Follow-up: Return in about 6 months (around 04/22/2020) for Hypertension.    [name], MD

## 2019-10-22 LAB — ESTRADIOL: Estradiol: 19 pg/mL

## 2019-10-22 LAB — LUTEINIZING HORMONE: LH: 64.3 m[IU]/mL

## 2019-10-22 LAB — PROGESTERONE: Progesterone: <0.5 ng/mL

## 2019-10-22 LAB — FOLLICLE STIMULATING HORMONE: FSH: 141.7 m[IU]/mL — ABNORMAL HIGH

## 2019-10-27 DIAGNOSIS — H18513 Endothelial corneal dystrophy, bilateral: Secondary | ICD-10-CM | POA: Diagnosis not present

## 2019-10-27 DIAGNOSIS — H2513 Age-related nuclear cataract, bilateral: Secondary | ICD-10-CM | POA: Diagnosis not present

## 2019-10-27 DIAGNOSIS — M069 Rheumatoid arthritis, unspecified: Secondary | ICD-10-CM | POA: Diagnosis not present

## 2019-10-27 DIAGNOSIS — Z79899 Other long term (current) drug therapy: Secondary | ICD-10-CM | POA: Diagnosis not present

## 2019-11-15 ENCOUNTER — Other Ambulatory Visit: Payer: Self-pay | Admitting: Family Medicine

## 2019-11-19 DIAGNOSIS — Z5181 Encounter for therapeutic drug level monitoring: Secondary | ICD-10-CM | POA: Diagnosis not present

## 2019-11-19 DIAGNOSIS — M1990 Unspecified osteoarthritis, unspecified site: Secondary | ICD-10-CM | POA: Diagnosis not present

## 2019-11-19 DIAGNOSIS — I10 Essential (primary) hypertension: Secondary | ICD-10-CM | POA: Diagnosis not present

## 2019-11-19 DIAGNOSIS — M359 Systemic involvement of connective tissue, unspecified: Secondary | ICD-10-CM | POA: Diagnosis not present

## 2019-11-19 DIAGNOSIS — I252 Old myocardial infarction: Secondary | ICD-10-CM | POA: Diagnosis not present

## 2019-11-19 DIAGNOSIS — I471 Supraventricular tachycardia: Secondary | ICD-10-CM | POA: Diagnosis not present

## 2019-11-19 DIAGNOSIS — R001 Bradycardia, unspecified: Secondary | ICD-10-CM | POA: Diagnosis not present

## 2019-11-19 DIAGNOSIS — I440 Atrioventricular block, first degree: Secondary | ICD-10-CM | POA: Diagnosis not present

## 2019-11-19 DIAGNOSIS — Z9889 Other specified postprocedural states: Secondary | ICD-10-CM | POA: Diagnosis not present

## 2019-11-19 DIAGNOSIS — Z79899 Other long term (current) drug therapy: Secondary | ICD-10-CM | POA: Diagnosis not present

## 2019-11-19 DIAGNOSIS — Z791 Long term (current) use of non-steroidal anti-inflammatories (NSAID): Secondary | ICD-10-CM | POA: Diagnosis not present

## 2019-11-19 DIAGNOSIS — Z7902 Long term (current) use of antithrombotics/antiplatelets: Secondary | ICD-10-CM | POA: Diagnosis not present

## 2019-11-20 DIAGNOSIS — R001 Bradycardia, unspecified: Secondary | ICD-10-CM | POA: Diagnosis not present

## 2019-11-20 DIAGNOSIS — I440 Atrioventricular block, first degree: Secondary | ICD-10-CM | POA: Diagnosis not present

## 2019-12-25 DIAGNOSIS — I471 Supraventricular tachycardia: Secondary | ICD-10-CM | POA: Diagnosis not present

## 2019-12-25 DIAGNOSIS — Z79899 Other long term (current) drug therapy: Secondary | ICD-10-CM | POA: Diagnosis not present

## 2019-12-25 DIAGNOSIS — Z5181 Encounter for therapeutic drug level monitoring: Secondary | ICD-10-CM | POA: Diagnosis not present

## 2020-01-15 ENCOUNTER — Other Ambulatory Visit: Payer: Self-pay | Admitting: Family Medicine

## 2020-01-15 DIAGNOSIS — I10 Essential (primary) hypertension: Secondary | ICD-10-CM

## 2020-01-16 DIAGNOSIS — M359 Systemic involvement of connective tissue, unspecified: Secondary | ICD-10-CM | POA: Diagnosis not present

## 2020-01-16 DIAGNOSIS — Z733 Stress, not elsewhere classified: Secondary | ICD-10-CM | POA: Diagnosis not present

## 2020-01-16 DIAGNOSIS — I471 Supraventricular tachycardia: Secondary | ICD-10-CM | POA: Diagnosis not present

## 2020-01-16 DIAGNOSIS — Z9889 Other specified postprocedural states: Secondary | ICD-10-CM | POA: Diagnosis not present

## 2020-01-16 DIAGNOSIS — Z79899 Other long term (current) drug therapy: Secondary | ICD-10-CM | POA: Diagnosis not present

## 2020-01-16 DIAGNOSIS — I10 Essential (primary) hypertension: Secondary | ICD-10-CM | POA: Diagnosis not present

## 2020-01-16 DIAGNOSIS — E785 Hyperlipidemia, unspecified: Secondary | ICD-10-CM | POA: Diagnosis not present

## 2020-01-16 DIAGNOSIS — I4510 Unspecified right bundle-branch block: Secondary | ICD-10-CM | POA: Diagnosis not present

## 2020-01-16 DIAGNOSIS — R9431 Abnormal electrocardiogram [ECG] [EKG]: Secondary | ICD-10-CM | POA: Diagnosis not present

## 2020-01-16 DIAGNOSIS — Z5181 Encounter for therapeutic drug level monitoring: Secondary | ICD-10-CM | POA: Diagnosis not present

## 2020-01-16 DIAGNOSIS — Z7982 Long term (current) use of aspirin: Secondary | ICD-10-CM | POA: Diagnosis not present

## 2020-02-24 ENCOUNTER — Other Ambulatory Visit: Payer: Self-pay | Admitting: Family Medicine

## 2020-02-24 DIAGNOSIS — M359 Systemic involvement of connective tissue, unspecified: Secondary | ICD-10-CM | POA: Diagnosis not present

## 2020-02-24 DIAGNOSIS — Z79899 Other long term (current) drug therapy: Secondary | ICD-10-CM | POA: Diagnosis not present

## 2020-04-08 DIAGNOSIS — R8290 Unspecified abnormal findings in urine: Secondary | ICD-10-CM | POA: Diagnosis not present

## 2020-04-08 DIAGNOSIS — R748 Abnormal levels of other serum enzymes: Secondary | ICD-10-CM | POA: Diagnosis not present

## 2020-06-02 DIAGNOSIS — Z9889 Other specified postprocedural states: Secondary | ICD-10-CM | POA: Diagnosis not present

## 2020-06-02 DIAGNOSIS — Z79899 Other long term (current) drug therapy: Secondary | ICD-10-CM | POA: Diagnosis not present

## 2020-06-02 DIAGNOSIS — I252 Old myocardial infarction: Secondary | ICD-10-CM | POA: Diagnosis not present

## 2020-06-02 DIAGNOSIS — I471 Supraventricular tachycardia: Secondary | ICD-10-CM | POA: Diagnosis not present

## 2020-06-05 DIAGNOSIS — R9431 Abnormal electrocardiogram [ECG] [EKG]: Secondary | ICD-10-CM | POA: Diagnosis not present

## 2020-07-05 ENCOUNTER — Other Ambulatory Visit: Payer: Self-pay | Admitting: Family Medicine

## 2020-07-05 DIAGNOSIS — I10 Essential (primary) hypertension: Secondary | ICD-10-CM

## 2020-07-05 NOTE — Telephone Encounter (Signed)
Pt scheduled for 07-19-20 for bp follow up

## 2020-07-05 NOTE — Telephone Encounter (Signed)
Please call pt to schedule an appt for follow up for bp. 30 day supply sent to pharmacy

## 2020-07-07 ENCOUNTER — Other Ambulatory Visit: Payer: Self-pay | Admitting: Family Medicine

## 2020-07-07 DIAGNOSIS — Z1231 Encounter for screening mammogram for malignant neoplasm of breast: Secondary | ICD-10-CM

## 2020-07-19 ENCOUNTER — Other Ambulatory Visit: Payer: Self-pay

## 2020-07-19 ENCOUNTER — Encounter: Payer: Self-pay | Admitting: Family Medicine

## 2020-07-19 ENCOUNTER — Ambulatory Visit (INDEPENDENT_AMBULATORY_CARE_PROVIDER_SITE_OTHER): Payer: Managed Care, Other (non HMO) | Admitting: Family Medicine

## 2020-07-19 VITALS — BP 131/54 | HR 60 | Ht 66.0 in | Wt 143.0 lb

## 2020-07-19 DIAGNOSIS — I471 Supraventricular tachycardia: Secondary | ICD-10-CM

## 2020-07-19 DIAGNOSIS — I10 Essential (primary) hypertension: Secondary | ICD-10-CM | POA: Diagnosis not present

## 2020-07-19 DIAGNOSIS — R066 Hiccough: Secondary | ICD-10-CM | POA: Diagnosis not present

## 2020-07-19 NOTE — Assessment & Plan Note (Signed)
Blood pressures are quite high most of them are running in the 140s and 150s.  The best blood pressure was 134/74 but the highest was 161/74.  I suspect that her blood pressure machine may be off as we have been getting much better blood pressures here when she comes in which she is actually getting at home.  Plan to bring in home cuff at next office visit.

## 2020-07-19 NOTE — Progress Notes (Signed)
Established Patient Office Visit  Subjective:  Patient ID: Mariah Mckinney, female    DOB: 15-Apr-1961  Age: 60 y.o. MRN: [id number]  CC:  Chief Complaint  Patient presents with  . Hypertension  . Hiccups    Pt reports that for the past 6 months she has been experiencing hiccups 2-3x a week. No changes in her diet      HPI Mariah Mckinney presents for 6 mo f/u for HTN  Hypertension- Pt denies chest pain, SOB, dizziness, or heart palpitations.  Taking meds as directed w/o problems.  Denies medication side effects.  Brought in home blood pressure log today.  C/o of hiccups 2-3 x a week for about 6 months. No recent med changes.  No GERD sxs. No SOB or cough.    Past Medical History:  Diagnosis Date  . Connective tissue disease ([place])   . Hypertension   . Raynaud's disease     Past Surgical History:  Procedure Laterality Date  . CARDIAC ELECTROPHYSIOLOGY MAPPING AND ABLATION    . HERNIA REPAIR  06-2005    Family History  Problem Relation Age of Onset  . Diabetes Mother   . Hypertension Father   . Diabetes Father   . Prostate cancer Father     Social History   Socioeconomic History  . Marital status: Married    Spouse name: Not on file  . Number of children: 1   . Years of education: Not on file  . Highest education level: Not on file  Occupational History  . Occupation: Stay at home mom  Tobacco Use  . Smoking status: Never Smoker  . Smokeless tobacco: Never Used  Substance and Sexual Activity  . Alcohol use: No  . Drug use: No  . Sexual activity: Yes    Partners: Male    Comment: married, 78 yr old son, walks daily.  Other Topics Concern  . Not on file  Social History Narrative   Walking for exercise.    Social Determinants of Health   Financial Resource Strain: Not on file  Food Insecurity: Not on file  Transportation Needs: Not on file  Physical Activity: Not on file  Stress: Not on file  Social Connections: Not on file  Intimate Partner  Violence: Not on file    Outpatient Medications Prior to Visit  Medication Sig Dispense Refill  . atorvastatin (LIPITOR) 20 MG tablet Take 1 tablet (20 mg total) by mouth daily at 12 noon. 90 tablet 3  . [facility] ASPIRIN ADULT LOW DOSE 81 MG chewable tablet TAKE 1 TABLET BY MOUTH EVERY DAY 90 tablet 3  . flecainide (TAMBOCOR) 100 MG tablet Take 1 tablet by mouth every 12 (twelve) hours.    . hydrochlorothiazide (HYDRODIURIL) 12.5 MG tablet TAKE 1 TABLET BY MOUTH EVERY DAY 30 tablet 0  . hydroxychloroquine (PLAQUENIL) 200 MG tablet Take 200 mg by mouth 2 (two) times daily.     . magnesium chloride (SLOW-MAG) 64 MG TBEC SR tablet Take 71.5 mg by mouth daily.    . metoprolol tartrate (LOPRESSOR) 50 MG tablet TAKE 1 TABLET BY MOUTH 2 TIMES DAILY 180 tablet 3  . nabumetone (RELAFEN) 750 MG tablet Take 750 mg by mouth 2 (two) times daily.    Marland Kitchen VITAMIN D PO Take 1 tablet by mouth daily.     No facility-administered medications prior to visit.    No Known Allergies  ROS Review of Systems    Objective:    Physical Exam Constitutional:  Appearance: She is well-developed and well-nourished.  HENT:     Head: Normocephalic and atraumatic.  Cardiovascular:     Rate and Rhythm: Normal rate and regular rhythm.     Heart sounds: Normal heart sounds.  Pulmonary:     Effort: Pulmonary effort is normal.     Breath sounds: Normal breath sounds.  Skin:    General: Skin is warm and dry.  Neurological:     Mental Status: She is alert and oriented to person, place, and time.  Psychiatric:        Mood and Affect: Mood and affect normal.        Behavior: Behavior normal.     BP (!) 131/54   Pulse 60   Ht 5\' 6"  (1.676 m)   Wt 143 lb (64.9 kg)   SpO2 100%   BMI 23.08 kg/m  Wt Readings from Last 3 Encounters:  07/19/20 143 lb (64.9 kg)  10/21/19 138 lb (62.6 kg)  09/01/19 141 lb (64 kg)     There are no preventive care reminders to display for this patient.  There are no preventive  care reminders to display for this patient.  Lab Results  Component Value Date   TSH 1.44 09/02/2019   Lab Results  Component Value Date   WBC 3.8 09/02/2019   HGB 11.5 (L) 09/02/2019   HCT 34.1 (L) 09/02/2019   MCV 90.2 09/02/2019   PLT 207 09/02/2019   Lab Results  Component Value Date   NA 133 (L) 09/02/2019   K 4.0 09/02/2019   CO2 27 09/02/2019   GLUCOSE 78 09/02/2019   BUN 8 09/02/2019   CREATININE 1.03 09/02/2019   BILITOT 0.5 09/02/2019   ALKPHOS 63 07/10/2016   AST 15 09/02/2019   ALT 9 09/02/2019   PROT 6.7 09/02/2019   ALBUMIN 4.3 07/10/2016   CALCIUM 9.2 09/02/2019   Lab Results  Component Value Date   CHOL 136 09/02/2019   Lab Results  Component Value Date   HDL 56 09/02/2019   Lab Results  Component Value Date   LDLCALC 67 09/02/2019   Lab Results  Component Value Date   TRIG 44 09/02/2019   Lab Results  Component Value Date   CHOLHDL 2.4 09/02/2019   No results found for: HGBA1C    Assessment & Plan:   Problem List Items Addressed This Visit      Cardiovascular and Mediastinum   SVT (supraventricular tachycardia) (HCC)    SVT controlled on current regimen she is not had any recent medication changes.      Essential hypertension - Primary    Blood pressures are quite high most of them are running in the 140s and 150s.  The best blood pressure was 134/74 but the highest was 161/74.  I suspect that her blood pressure machine may be off as we have been getting much better blood pressures here when she comes in which she is actually getting at home.  Plan to bring in home cuff at next office visit.       Other Visit Diagnoses    Hiccups          Hiccups-unclear etiology.  We did discuss possible trial of reflux medicine to see if that helps.  No recent medication changes etc. so I am not really sure what is causing it.  No orders of the defined types were placed in this encounter.   Follow-up: Return in about 7 weeks (around  09/06/2020) for CPE.  [name], MD

## 2020-07-19 NOTE — Assessment & Plan Note (Addendum)
SVT controlled on current regimen she is not had any recent medication changes.

## 2020-07-19 NOTE — Progress Notes (Signed)
Doing well on current regimen. Denies any SOB/CP or lightheadedness.

## 2020-07-19 NOTE — Patient Instructions (Signed)
Please bring in your blood pressure cuff with you at the next office visit so we can compare it to our machine and see if maybe your machine is reading a little bit higher or if it is pretty close and your home numbers are fairly accurate.  Continue work on low-salt diet and regular exercise.

## 2020-07-31 ENCOUNTER — Other Ambulatory Visit: Payer: Self-pay | Admitting: Family Medicine

## 2020-07-31 DIAGNOSIS — I10 Essential (primary) hypertension: Secondary | ICD-10-CM

## 2020-08-05 ENCOUNTER — Other Ambulatory Visit: Payer: Self-pay

## 2020-08-05 ENCOUNTER — Ambulatory Visit (INDEPENDENT_AMBULATORY_CARE_PROVIDER_SITE_OTHER): Payer: Managed Care, Other (non HMO)

## 2020-08-05 DIAGNOSIS — Z1231 Encounter for screening mammogram for malignant neoplasm of breast: Secondary | ICD-10-CM

## 2020-09-06 ENCOUNTER — Encounter: Payer: Managed Care, Other (non HMO) | Admitting: Family Medicine

## 2020-09-30 ENCOUNTER — Ambulatory Visit (INDEPENDENT_AMBULATORY_CARE_PROVIDER_SITE_OTHER): Payer: Managed Care, Other (non HMO) | Admitting: Family Medicine

## 2020-09-30 ENCOUNTER — Encounter: Payer: Self-pay | Admitting: Family Medicine

## 2020-09-30 VITALS — BP 118/62 | HR 68 | Ht 66.0 in | Wt 140.0 lb

## 2020-09-30 DIAGNOSIS — Z0000 Encounter for general adult medical examination without abnormal findings: Secondary | ICD-10-CM

## 2020-09-30 DIAGNOSIS — L989 Disorder of the skin and subcutaneous tissue, unspecified: Secondary | ICD-10-CM | POA: Diagnosis not present

## 2020-09-30 DIAGNOSIS — I10 Essential (primary) hypertension: Secondary | ICD-10-CM

## 2020-09-30 MED ORDER — LOSARTAN POTASSIUM-HCTZ 50-12.5 MG PO TABS: 1.0000 | ORAL_TABLET | Freq: Every day | ORAL | 1 refills | Status: DC

## 2020-09-30 NOTE — Progress Notes (Signed)
Subjective:     Mariah Mckinney is a 60 y.o. female and is here for a comprehensive physical exam. The patient reports no problems.  She is doing well overall she is been walking 3 days a week for exercise.  She does have a couple skin lesions she would like to look at today.  They are hyperpigmented papules she has 2 on her left leg, 1 on her right side and one new one that has popped up in the cleavage area on her anterior chest that has been a little itchy.  She says her sister has had similar lesions 1 of which showed some abnormal cells on biopsy.  Follow-up hypertension she did bring in a home blood pressure log today half of the blood pressures were elevated in the 140s and 130s.  Social History   Socioeconomic History  . Marital status: Married    Spouse name: Not on file  . Number of children: 1   . Years of education: Not on file  . Highest education level: Not on file  Occupational History  . Occupation: Stay at home mom  Tobacco Use  . Smoking status: Never Smoker  . Smokeless tobacco: Never Used  Substance and Sexual Activity  . Alcohol use: No  . Drug use: No  . Sexual activity: Yes    Partners: Male    Comment: married, 84 yr old son, walks daily.  Other Topics Concern  . Not on file  Social History Narrative   Walking for exercise.    Social Determinants of Health   Financial Resource Strain: Not on file  Food Insecurity: Not on file  Transportation Needs: Not on file  Physical Activity: Not on file  Stress: Not on file  Social Connections: Not on file  Intimate Partner Violence: Not on file   Health Maintenance  Topic Date Due  . INFLUENZA VACCINE  01/10/2021  . TETANUS/TDAP  12/12/2021  . MAMMOGRAM  08/05/2022  . COLONOSCOPY (Pts 45-29yrs Insurance coverage will need to be confirmed)  01/09/2024  . PAP SMEAR-Modifier  08/31/2024  . COVID-19 Vaccine  Completed  . Hepatitis C Screening  Completed  . HIV Screening  Completed  . HPV VACCINES  Aged Out     The following portions of the patient's history were reviewed and updated as appropriate: allergies, current medications, past family history, past medical history, past social history, past surgical history and problem list.  Review of Systems A comprehensive review of systems was negative.   Objective:    BP 118/62   Pulse 68   Ht 5\' 6"  (1.676 m)   Wt 140 lb (63.5 kg)   SpO2 98%   BMI 22.60 kg/m  General appearance: alert, cooperative and appears stated age Head: Normocephalic, without obvious abnormality, atraumatic Eyes: conj clear, EOMI, PEERLA Ears: normal TM's and external ear canals both ears Nose: Nares normal. Septum midline. Mucosa normal. No drainage or sinus tenderness. Throat: lips, mucosa, and tongue normal; teeth and gums normal Neck: no adenopathy, no carotid bruit, no JVD, supple, symmetrical, trachea midline and thyroid not enlarged, symmetric, no tenderness/mass/nodules Back: symmetric, no curvature. ROM normal. No CVA tenderness. Lungs: clear to auscultation bilaterally Heart: regular rate and rhythm, S1, S2 normal, no murmur, click, rub or gallop Abdomen: soft, non-tender; bowel sounds normal; no masses,  no organomegaly Extremities: extremities normal, atraumatic, no cyanosis or edema Pulses: 2+ and symmetric Skin: Skin color, texture, turgor normal. No rashes or lesions Lymph nodes: Cervical adenopathy: nl and  Supraclavicular adenopathy: nl Neurologic: Alert and oriented X 3, normal strength and tone. Normal symmetric reflexes. Normal coordination and gait    Assessment:    Healthy female exam.      Plan:     See After Visit Summary for Counseling Recommendations   Keep up a regular exercise program and make sure you are eating a healthy diet Try to eat 4 servings of dairy a day, or if you are lactose intolerant take a calcium with vitamin D daily.  Your vaccines are up to date.   HTN -brought in log of home blood pressures.  Half of them are  uncontrolled so we discussed switching the HCTZ to losartan HCT instead.  We did get a big difference in our blood pressure here versus her home blood pressure today.  Ours look fantastic but hers was elevated so I do wonder if her machine might be reading a little higher than ours.  So I did warn her to monitor for any lightheadedness dizziness or weakness after we adjust her blood pressure medication we will have her come back in 2 weeks follow-up in 2 weeks for nurse blood pressure check.

## 2020-09-30 NOTE — Patient Instructions (Signed)
Discontinue your hydrochlorothiazide and a medicine and a prescription for losartan with hydrochlorothiazide in it.  It will still be generic and taken once a day this will just be in its place.

## 2020-10-01 LAB — CBC
HCT: 37.2 % (ref 35.0–45.0)
Hemoglobin: 12.2 g/dL (ref 11.7–15.5)
MCH: 29.3 pg (ref 27.0–33.0)
MCHC: 32.8 g/dL (ref 32.0–36.0)
MCV: 89.4 fL (ref 80.0–100.0)
MPV: 10.8 fL (ref 7.5–12.5)
Platelets: 218 10*3/uL (ref 140–400)
RBC: 4.16 10*6/uL (ref 3.80–5.10)
RDW: 12.7 % (ref 11.0–15.0)
WBC: 3.2 10*3/uL — ABNORMAL LOW (ref 3.8–10.8)

## 2020-10-01 LAB — LIPID PANEL
Cholesterol: 166 mg/dL (ref <200)
HDL: 68 mg/dL (ref >=50)
LDL Cholesterol (Calc): 87 mg/dL (calc)
Non-HDL Cholesterol (Calc): 98 mg/dL (calc) (ref <130)
Total CHOL/HDL Ratio: 2.4 (calc) (ref <5.0)
Triglycerides: 38 mg/dL (ref <150)

## 2020-10-01 LAB — COMPLETE METABOLIC PANEL WITH GFR
AG Ratio: 1.4 (calc) (ref 1.0–2.5)
ALT: 20 U/L (ref 6–29)
AST: 23 U/L (ref 10–35)
Albumin: 4.4 g/dL (ref 3.6–5.1)
Alkaline phosphatase (APISO): 107 U/L (ref 37–153)
BUN: 10 mg/dL (ref 7–25)
CO2: 28 mmol/L (ref 20–32)
Calcium: 9.9 mg/dL (ref 8.6–10.4)
Chloride: 97 mmol/L — ABNORMAL LOW (ref 98–110)
Creat: 0.97 mg/dL (ref 0.50–1.05)
GFR, Est African American: 75 mL/min/{1.73_m2} (ref >=60)
GFR, Est Non African American: 64 mL/min/{1.73_m2} (ref >=60)
Globulin: 3.2 g/dL (calc) (ref 1.9–3.7)
Glucose, Bld: 92 mg/dL (ref 65–99)
Potassium: 4.3 mmol/L (ref 3.5–5.3)
Sodium: 134 mmol/L — ABNORMAL LOW (ref 135–146)
Total Bilirubin: 0.5 mg/dL (ref 0.2–1.2)
Total Protein: 7.6 g/dL (ref 6.1–8.1)

## 2020-10-03 ENCOUNTER — Other Ambulatory Visit: Payer: Self-pay | Admitting: Family Medicine

## 2020-10-14 ENCOUNTER — Other Ambulatory Visit: Payer: Self-pay

## 2020-10-14 ENCOUNTER — Ambulatory Visit (INDEPENDENT_AMBULATORY_CARE_PROVIDER_SITE_OTHER): Payer: Managed Care, Other (non HMO) | Admitting: Family Medicine

## 2020-10-14 VITALS — BP 110/59 | HR 59

## 2020-10-14 DIAGNOSIS — I10 Essential (primary) hypertension: Secondary | ICD-10-CM

## 2020-10-14 NOTE — Progress Notes (Signed)
Perfect. BP at goal.

## 2020-10-14 NOTE — Progress Notes (Signed)
Pt in for BP check this morning.  Pt has not taken her medications yet this morning.  Her first reading was 146/71 and after sitting for 10 or so min her new reading was 110/59.

## 2020-11-07 ENCOUNTER — Other Ambulatory Visit: Payer: Self-pay | Admitting: Family Medicine

## 2020-12-05 ENCOUNTER — Other Ambulatory Visit: Payer: Self-pay | Admitting: Family Medicine

## 2021-01-05 ENCOUNTER — Other Ambulatory Visit: Payer: Self-pay | Admitting: Family Medicine

## 2021-01-06 ENCOUNTER — Telehealth: Payer: Self-pay

## 2021-01-06 NOTE — Telephone Encounter (Signed)
Pt called to inquire as to why her refill for losartan was denied.  Advised pt that it was denied because she was given a 90 day supply with one refill on 09/30/20 and is therefore not due for any additional refills.  Pt advised to contact pharmacy and discuss refilling the medication.  Pt expressed understanding and is agreeable.  Charyl Bigger, CMA

## 2021-03-29 ENCOUNTER — Other Ambulatory Visit: Payer: Self-pay | Admitting: Family Medicine

## 2021-08-03 ENCOUNTER — Telehealth: Payer: Self-pay | Admitting: General Practice

## 2021-08-03 NOTE — Telephone Encounter (Signed)
Transition Care Management Follow-up Telephone Call Date of discharge and from where: 08/02/21 from [facility] How have you been since you were released from the hospital? Doing better. Was started on Verapamil.  Any questions or concerns? No  Items Reviewed: Did the pt receive and understand the discharge instructions provided? Yes  Medications obtained and verified? No  Other? No  Any new allergies since your discharge? No  Dietary orders reviewed? Yes Do you have support at home? Yes   Home Care and Equipment/Supplies: Were home health services ordered? no  Functional Questionnaire: (I = Independent and D = Dependent) ADLs: I  Bathing/Dressing- I  Meal Prep- I  Eating- I  Maintaining continence- I  Transferring/Ambulation- I  Managing Meds- I  Follow up appointments reviewed:  PCP Hospital f/u appt confirmed? Yes  Scheduled to see Dr. Madilyn Fireman on 08/18/21 @ 1010. [facility] f/u appt confirmed? No   Are transportation arrangements needed? No  If their condition worsens, is the pt aware to call PCP or go to the Emergency Dept.? Yes Was the patient provided with contact information for the PCP's office or ED? Yes Was to pt encouraged to call back with questions or concerns? Yes

## 2021-08-18 ENCOUNTER — Other Ambulatory Visit: Payer: Self-pay

## 2021-08-18 ENCOUNTER — Ambulatory Visit: Payer: Managed Care, Other (non HMO) | Admitting: Family Medicine

## 2021-08-18 ENCOUNTER — Encounter: Payer: Self-pay | Admitting: Family Medicine

## 2021-08-18 VITALS — BP 162/69 | HR 71 | Resp 16 | Ht 66.0 in | Wt 144.0 lb

## 2021-08-18 DIAGNOSIS — I471 Supraventricular tachycardia: Secondary | ICD-10-CM | POA: Diagnosis not present

## 2021-08-18 DIAGNOSIS — I10 Essential (primary) hypertension: Secondary | ICD-10-CM

## 2021-08-18 DIAGNOSIS — M359 Systemic involvement of connective tissue, unspecified: Secondary | ICD-10-CM | POA: Diagnosis not present

## 2021-08-18 NOTE — Progress Notes (Signed)
? ?Established Patient Office Visit ? ?Subjective:  ?Patient ID: Mariah Mckinney, female    DOB: 04-Dec-1960  Age: 61 y.o. MRN: [id number] ? ?CC:  ?Chief Complaint  ?Patient presents with  ? Hospital Follow up   ?  SVT follow up   ? Immunizations  ?  Patient would like to wait on Shingles vaccine at this time.   ? ? ?HPI ?Mariah Mckinney presents for  ? ?Hypertension- Pt denies chest pain, SOB, dizziness, or heart palpitations.  Taking meds as directed w/o problems.  Denies medication side effects.   ? ?She was also recently seen in the ED fore SVT about 3 weeks ago.  She was admitted from February 19 to February 21.  She had recently had her flecainide dose reduced.  When she arrived in the emergency department after feeling symptomatic her heart rate was 170 and she was hypotensive at 86/48.  Hydrochlorothiazide was discontinued from her losartan HCT and she was just switched to Cozaar 50 mg.  She was also started on sotalol 80 mg twice a day.  The flecainide was discontinued and her metoprolol was discontinued. ? ? ?Past Medical History:  ?Diagnosis Date  ? Connective tissue disease ([place])   ? Hypertension   ? Raynaud's disease   ? ? ?Past Surgical History:  ?Procedure Laterality Date  ? CARDIAC ELECTROPHYSIOLOGY MAPPING AND ABLATION    ? HERNIA REPAIR  06-2005  ? ? ?Family History  ?Problem Relation Age of Onset  ? Diabetes Mother   ? Hypertension Father   ? Diabetes Father   ? Prostate cancer Father   ? ? ?Social History  ? ?Socioeconomic History  ? Marital status: Married  ?  Spouse name: Not on file  ? Number of children: 1   ? Years of education: Not on file  ? Highest education level: Not on file  ?Occupational History  ? Occupation: Stay at home mom  ?Tobacco Use  ? Smoking status: Never  ? Smokeless tobacco: Never  ?Substance and Sexual Activity  ? Alcohol use: No  ? Drug use: No  ? Sexual activity: Yes  ?  Partners: Male  ?  Comment: married, 27 yr old son, walks daily.  ?Other Topics Concern  ? Not on  file  ?Social History Narrative  ? Walking for exercise.   ? ?Social Determinants of Health  ? ?Financial Resource Strain: Not on file  ?Food Insecurity: Not on file  ?Transportation Needs: Not on file  ?Physical Activity: Not on file  ?Stress: Not on file  ?Social Connections: Not on file  ?Intimate Partner Violence: Not on file  ? ? ?Outpatient Medications Prior to Visit  ?Medication Sig Dispense Refill  ? atorvastatin (LIPITOR) 20 MG tablet TAKE 1 TABLET (20 MG TOTAL) BY MOUTH DAILY AT 12 NOON. 90 tablet 3  ? [facility] ASPIRIN ADULT LOW DOSE 81 MG chewable tablet TAKE 1 TABLET BY MOUTH EVERY DAY 90 tablet 3  ? Ergocalciferol (VITAMIN D2) 50 MCG (2000 UT) TABS Take by mouth.    ? Glycerin, PF, (OASIS TEARS PLUS PF) 0.22 % SOLN Apply to eye.    ? losartan (COZAAR) 50 MG tablet Take 1 tablet by mouth daily.    ? magnesium chloride (SLOW-MAG) 64 MG TBEC SR tablet Take 71.5 mg by mouth daily.    ? nabumetone (RELAFEN) 750 MG tablet Take 750 mg by mouth 2 (two) times daily.    ? sotalol (BETAPACE) 80 MG tablet Take 80 mg by mouth  2 (two) times daily.    ? VITAMIN D PO Take 1 tablet by mouth daily.    ? flecainide (TAMBOCOR) 100 MG tablet Take 1 tablet by mouth every 12 (twelve) hours.    ? hydroxychloroquine (PLAQUENIL) 200 MG tablet Take 200 mg by mouth 2 (two) times daily.    ? losartan-hydrochlorothiazide (HYZAAR) 50-12.5 MG tablet TAKE 1 TABLET BY MOUTH EVERY DAY 90 tablet 1  ? metoprolol tartrate (LOPRESSOR) 50 MG tablet TAKE 1 TABLET BY MOUTH 2 TIMES DAILY 180 tablet 3  ? ?No facility-administered medications prior to visit.  ? ? ?Allergies  ?Allergen Reactions  ? Hydroxychloroquine Other (See Comments)  ?  Eye issues ?Eye issues ?  ? ? ?ROS ?Review of Systems ? ?  ?Objective:  ?  ?Physical Exam ?Constitutional:   ?   Appearance: Normal appearance. She is well-developed.  ?HENT:  ?   Head: Normocephalic and atraumatic.  ?Cardiovascular:  ?   Rate and Rhythm: Normal rate and regular rhythm.  ?   Heart sounds: Normal  heart sounds.  ?Pulmonary:  ?   Effort: Pulmonary effort is normal.  ?   Breath sounds: Normal breath sounds.  ?Skin: ?   General: Skin is warm and dry.  ?Neurological:  ?   Mental Status: She is alert and oriented to person, place, and time.  ?Psychiatric:     ?   Behavior: Behavior normal.  ? ? ?BP (!) 162/69 (BP Location: Right Arm)   Pulse 71   Resp 16   Ht '5\' 6"'$  (1.676 m)   Wt 144 lb (65.3 kg)   LMP 11/04/2015   SpO2 100%   BMI 23.24 kg/m?  ?Wt Readings from Last 3 Encounters:  ?08/18/21 144 lb (65.3 kg)  ?09/30/20 140 lb (63.5 kg)  ?07/19/20 143 lb (64.9 kg)  ? ? ? ?Health Maintenance Due  ?Topic Date Due  ? Zoster Vaccines- Shingrix (1 of 2) Never done  ? ? ?There are no preventive care reminders to display for this patient. ? ?Lab Results  ?Component Value Date  ? TSH 1.44 09/02/2019  ? ?Lab Results  ?Component Value Date  ? WBC 3.2 (L) 09/30/2020  ? HGB 12.2 09/30/2020  ? HCT 37.2 09/30/2020  ? MCV 89.4 09/30/2020  ? PLT 218 09/30/2020  ? ?Lab Results  ?Component Value Date  ? NA 134 (L) 09/30/2020  ? K 4.3 09/30/2020  ? CO2 28 09/30/2020  ? GLUCOSE 92 09/30/2020  ? BUN 10 09/30/2020  ? CREATININE 0.97 09/30/2020  ? BILITOT 0.5 09/30/2020  ? ALKPHOS 63 07/10/2016  ? AST 23 09/30/2020  ? ALT 20 09/30/2020  ? PROT 7.6 09/30/2020  ? ALBUMIN 4.3 07/10/2016  ? CALCIUM 9.9 09/30/2020  ? ?Lab Results  ?Component Value Date  ? CHOL 166 09/30/2020  ? ?Lab Results  ?Component Value Date  ? HDL 68 09/30/2020  ? ?Lab Results  ?Component Value Date  ? [place] 87 09/30/2020  ? ?Lab Results  ?Component Value Date  ? TRIG 38 09/30/2020  ? ?Lab Results  ?Component Value Date  ? CHOLHDL 2.4 09/30/2020  ? ?No results found for: HGBA1C ? ?  ?Assessment & Plan:  ? ?Problem List Items Addressed This Visit   ? ?  ? Cardiovascular and Mediastinum  ? SVT (supraventricular tachycardia) ([place])  ?  Feels like she is doing well on sotalol.  She actually does not have a follow-up coming up anytime soon in fact its not until  August.  But right now we will focus on getting her blood pressure under control since she is currently asymptomatic in regards to tachycardia. ?  ?  ? Relevant Medications  ? losartan (COZAAR) 50 MG tablet  ? sotalol (BETAPACE) 80 MG tablet  ? Essential hypertension - Primary  ?  Pressure was significantly elevated today.  They had stopped her hydrochlorothiazide as well as her metoprolol in the hospital.  Repeat blood pressure was still high today but she has not checked it over the weekend she has a BP cuff at home and she will let me know Monday or Tuesday via MyChart and we can decide if we can at least bump up the losartan to 100 mg she is currently taking 50.  ?  ?  ? Relevant Medications  ? losartan (COZAAR) 50 MG tablet  ? sotalol (BETAPACE) 80 MG tablet  ?  ? Other  ? Connective tissue disease ([place])  ?  Actually took her off of Plaquenil because it was starting to affect her eyes.  Right now they are holding off on starting anything new and will follow back up at some point. ?  ?  ? ? ?She is planning on scheduling her physical at the end of April so we can follow-up labs and blood pressure at that time. ? ?No orders of the defined types were placed in this encounter. ? ? ?Follow-up: No follow-ups on file.  ? ? ?[name], MD ?

## 2021-08-18 NOTE — Patient Instructions (Signed)
Send my chart with your BP readings ?

## 2021-08-18 NOTE — Assessment & Plan Note (Signed)
Feels like she is doing well on sotalol.  She actually does not have a follow-up coming up anytime soon in fact its not until August.  But right now we will focus on getting her blood pressure under control since she is currently asymptomatic in regards to tachycardia. ?

## 2021-08-18 NOTE — Assessment & Plan Note (Signed)
Actually took her off of Plaquenil because it was starting to affect her eyes.  Right now they are holding off on starting anything new and will follow back up at some point. ?

## 2021-08-18 NOTE — Assessment & Plan Note (Signed)
Pressure was significantly elevated today.  They had stopped her hydrochlorothiazide as well as her metoprolol in the hospital.  Repeat blood pressure was still high today but she has not checked it over the weekend she has a BP cuff at home and she will let me know Monday or Tuesday via MyChart and we can decide if we can at least bump up the losartan to 100 mg she is currently taking 50.  ?

## 2021-08-26 ENCOUNTER — Other Ambulatory Visit: Payer: Self-pay | Admitting: Family Medicine

## 2021-08-26 ENCOUNTER — Encounter: Payer: Self-pay | Admitting: Family Medicine

## 2021-08-26 DIAGNOSIS — Z1231 Encounter for screening mammogram for malignant neoplasm of breast: Secondary | ICD-10-CM

## 2021-09-22 ENCOUNTER — Ambulatory Visit (INDEPENDENT_AMBULATORY_CARE_PROVIDER_SITE_OTHER): Payer: Managed Care, Other (non HMO)

## 2021-09-22 DIAGNOSIS — Z1231 Encounter for screening mammogram for malignant neoplasm of breast: Secondary | ICD-10-CM

## 2021-09-22 NOTE — Progress Notes (Signed)
Please call patient. Normal mammogram.  Repeat in 1 year.  

## 2021-09-23 ENCOUNTER — Other Ambulatory Visit: Payer: Self-pay | Admitting: Family Medicine

## 2021-10-02 ENCOUNTER — Other Ambulatory Visit: Payer: Self-pay | Admitting: Family Medicine

## 2021-10-03 ENCOUNTER — Ambulatory Visit (INDEPENDENT_AMBULATORY_CARE_PROVIDER_SITE_OTHER): Payer: Managed Care, Other (non HMO) | Admitting: Family Medicine

## 2021-10-03 ENCOUNTER — Encounter: Payer: Self-pay | Admitting: Family Medicine

## 2021-10-03 VITALS — BP 137/62 | HR 63 | Ht 66.0 in | Wt 145.0 lb

## 2021-10-03 DIAGNOSIS — Z0000 Encounter for general adult medical examination without abnormal findings: Secondary | ICD-10-CM

## 2021-10-03 DIAGNOSIS — Z808 Family history of malignant neoplasm of other organs or systems: Secondary | ICD-10-CM

## 2021-10-03 NOTE — Progress Notes (Signed)
? ?Complete physical exam ? ?Patient: Mariah Mckinney   DOB: [birth year]   60 y.o. Female  MRN: [id number] ? ?Subjective:  ?  ?Chief Complaint  ?Patient presents with  ? Annual Exam  ? ? ?Mariah Mckinney is a 61 y.o. female who presents today for a complete physical exam. She reports consuming a general diet. Home exercise routine includes walking 2-3 times a week. She generally feels well. She reports sleeping well. She does not have additional problems to discuss today.  She recently had a slight bump in liver enzymes but it is being followed back up by her rheumatologist so he is can to do that next month.  She said she had it happen before but it had gone back down on its own. ? ?Is now seeing dermatology yearly.  Her sister had diagnosis of melanoma. ? ?Most recent fall risk assessment: ? ?  10/03/2021  ?  9:45 AM  ?Fall Risk   ?Falls in the past year? 0  ?Number falls in past yr: 0  ?Injury with Fall? 0  ?Risk for fall due to : No Fall Risks  ?Follow up Falls prevention discussed  ? ?  ?Most recent depression screenings: ? ?  10/03/2021  ?  9:45 AM 09/30/2020  ?  9:12 AM  ?PHQ 2/9 Scores  ?PHQ - 2 Score 0 0  ? ? ? ? ? ? ?Patient Care Team: ?Hali Marry, MD as PCP - General (Family Medicine)  ? ?Outpatient Medications Prior to Visit  ?Medication Sig  ? CVS ASPIRIN ADULT LOW DOSE 81 MG chewable tablet TAKE 1 TABLET BY MOUTH EVERY DAY  ? Ergocalciferol (VITAMIN D2) 50 MCG (2000 UT) TABS Take by mouth.  ? Glycerin, PF, (OASIS TEARS PLUS PF) 0.22 % SOLN Apply to eye.  ? losartan (COZAAR) 50 MG tablet Take 1 tablet by mouth daily.  ? magnesium chloride (SLOW-MAG) 64 MG TBEC SR tablet Take 71.5 mg by mouth daily.  ? sotalol (BETAPACE) 80 MG tablet Take 80 mg by mouth 2 (two) times daily.  ? VITAMIN D PO Take 1 tablet by mouth daily.  ? [DISCONTINUED] atorvastatin (LIPITOR) 20 MG tablet TAKE 1 TABLET (20 MG TOTAL) BY MOUTH DAILY AT 12 NOON.  ? [DISCONTINUED] nabumetone (RELAFEN) 750 MG tablet Take 750 mg by  mouth 2 (two) times daily.  ? ?No facility-administered medications prior to visit.  ? ? ?ROS ? ? ? ? ?   ?Objective:  ? ?  ?BP 137/62   Pulse 63   Ht '5\' 6"'$  (1.676 m)   Wt 145 lb (65.8 kg)   LMP 11/04/2015   SpO2 100%   BMI 23.40 kg/m?  ? ? ?Physical Exam ?Constitutional:   ?   Appearance: She is well-developed.  ?HENT:  ?   Head: Normocephalic and atraumatic.  ?   Right Ear: External ear normal.  ?   Left Ear: External ear normal.  ?   Nose: Nose normal.  ?Eyes:  ?   Conjunctiva/sclera: Conjunctivae normal.  ?   Pupils: Pupils are equal, round, and reactive to light.  ?Neck:  ?   Thyroid: No thyromegaly.  ?Cardiovascular:  ?   Rate and Rhythm: Normal rate and regular rhythm.  ?   Heart sounds: Normal heart sounds.  ?Pulmonary:  ?   Effort: Pulmonary effort is normal.  ?   Breath sounds: Normal breath sounds. No wheezing.  ?Abdominal:  ?   General: Bowel sounds are normal.  ?  Palpations: Abdomen is soft.  ?Musculoskeletal:  ?   Cervical back: Neck supple.  ?Lymphadenopathy:  ?   Cervical: No cervical adenopathy.  ?Skin: ?   General: Skin is warm and dry.  ?Neurological:  ?   Mental Status: She is alert and oriented to person, place, and time.  ?  ? ?No results found for any visits on 10/03/21. ? ?   ?Assessment & Plan:  ?  ?Routine Health Maintenance and Physical Exam ? ?Immunization History  ?Administered Date(s) Administered  ? Influenza Inj Mdck Quad Pf 03/17/2017, 03/25/2020, 03/25/2021  ? Influenza,inj,Quad PF,6+ Mos 04/22/2018, 03/03/2019  ? Influenza-Unspecified 03/13/2016, 04/16/2017, 04/22/2018  ? Janssen (J&J) SARS-COV-2 Vaccination 08/23/2019  ? PFIZER(Purple Top)SARS-COV-2 Vaccination 04/28/2020  ? Tdap 01/05/2005, 12/13/2011  ? ? ?Health Maintenance  ?Topic Date Due  ? COVID-19 Vaccine (3 - Booster for Janssen series) 10/20/2022 (Originally 06/23/2020)  ? Zoster Vaccines- Shingrix (1 of 2) 01/03/2023 (Originally 06/26/1981)  ? TETANUS/TDAP  12/12/2021  ? INFLUENZA VACCINE  01/10/2022  ? MAMMOGRAM   09/23/2023  ? COLONOSCOPY (Pts 45-52yr Insurance coverage will need to be confirmed)  01/09/2024  ? PAP SMEAR-Modifier  08/31/2024  ? Hepatitis C Screening  Completed  ? HIV Screening  Completed  ? Pneumococcal Vaccine 162620Years old  Aged Out  ? HPV VACCINES  Aged Out  ? ? ?Discussed health benefits of physical activity, and encouraged her to engage in regular exercise appropriate for her age and condition. ? ?Problem List Items Addressed This Visit   ? ?  ? Other  ? Family history of malignant melanoma  ? ?Other Visit Diagnoses   ? ? Wellness examination    -  Primary  ? Relevant Orders  ? Lipid panel  ? BASIC METABOLIC PANEL WITH GFR  ? ?  ? ?Keep up a regular exercise program and make sure you are eating a healthy diet ?Try to eat 4 servings of dairy a day, or if you are lactose intolerant take a calcium with vitamin D daily.  ?Your vaccines are up to date.  Reminded her that she is due for Tdap this summer. ?Urged her to continue to work on exercise she mostly walks. ? ? ?Return in about 1 year (around 10/04/2022) for Wellness Exam. ? ?  ? ?CBeatrice Lecher MD ? ? ?

## 2021-10-05 LAB — BASIC METABOLIC PANEL WITH GFR
BUN: 7 mg/dL (ref 7–25)
CO2: 26 mmol/L (ref 20–32)
Calcium: 10.4 mg/dL (ref 8.6–10.4)
Chloride: 102 mmol/L (ref 98–110)
Creat: 0.95 mg/dL (ref 0.50–1.03)
Glucose, Bld: 84 mg/dL (ref 65–99)
Potassium: 4.6 mmol/L (ref 3.5–5.3)
Sodium: 138 mmol/L (ref 135–146)
eGFR: 69 mL/min/{1.73_m2} (ref >=60)

## 2021-10-05 LAB — LIPID PANEL
Cholesterol: 184 mg/dL (ref <200)
HDL: 77 mg/dL (ref >=50)
LDL Cholesterol (Calc): 94 mg/dL (calc)
Non-HDL Cholesterol (Calc): 107 mg/dL (calc) (ref <130)
Total CHOL/HDL Ratio: 2.4 (calc) (ref <5.0)
Triglycerides: 45 mg/dL (ref <150)

## 2021-10-05 NOTE — Progress Notes (Signed)
Your lab work is within acceptable range and there are no concerning findings.   ?

## 2021-10-10 ENCOUNTER — Other Ambulatory Visit: Payer: Self-pay

## 2021-10-10 DIAGNOSIS — I10 Essential (primary) hypertension: Secondary | ICD-10-CM

## 2021-10-10 MED ORDER — LOSARTAN POTASSIUM 100 MG PO TABS: 100.0000 mg | ORAL_TABLET | Freq: Every day | ORAL | 1 refills | Status: DC

## 2021-10-19 ENCOUNTER — Other Ambulatory Visit: Payer: Self-pay | Admitting: *Deleted

## 2021-10-19 MED ORDER — SOTALOL HCL 80 MG PO TABS
80.0000 mg | ORAL_TABLET | Freq: Two times a day (BID) | ORAL | 1 refills | Status: AC
Start: 2021-10-19 — End: ?

## 2022-01-27 ENCOUNTER — Other Ambulatory Visit: Payer: Self-pay | Admitting: Family Medicine

## 2022-04-06 ENCOUNTER — Other Ambulatory Visit: Payer: Self-pay | Admitting: Family Medicine

## 2022-04-06 DIAGNOSIS — I10 Essential (primary) hypertension: Secondary | ICD-10-CM

## 2022-08-15 IMAGING — MG MM DIGITAL SCREENING BILAT W/ TOMO AND CAD
6 of 12 series · 6 of 36 positions shown · non-contrast
Comparison: Previous exam(s).

CLINICAL DATA: Screening.

EXAM:
DIGITAL SCREENING BILATERAL MAMMOGRAM WITH TOMOSYNTHESIS AND CAD
TECHNIQUE: Bilateral screening digital craniocaudal and mediolateral oblique
mammograms were obtained. Bilateral screening digital breast
tomosynthesis was performed. The images were evaluated with
computer-aided detection.

[L CC synth-2D]
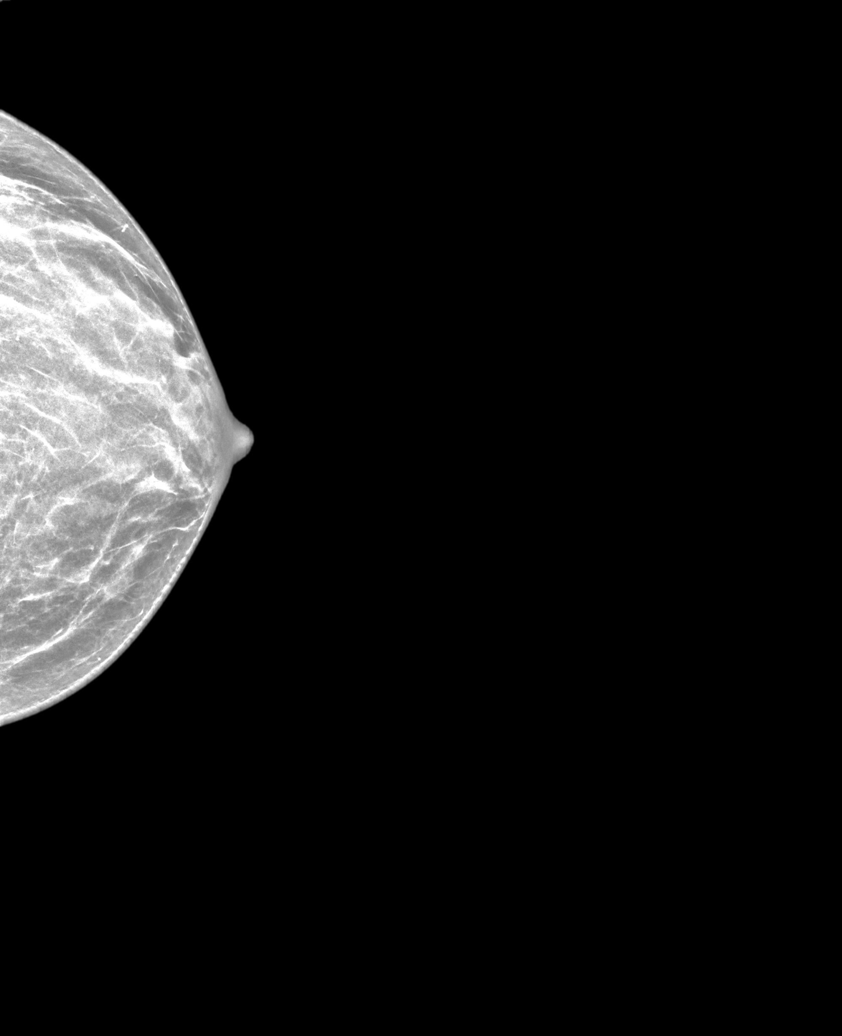

[R XCCL synth-2D]
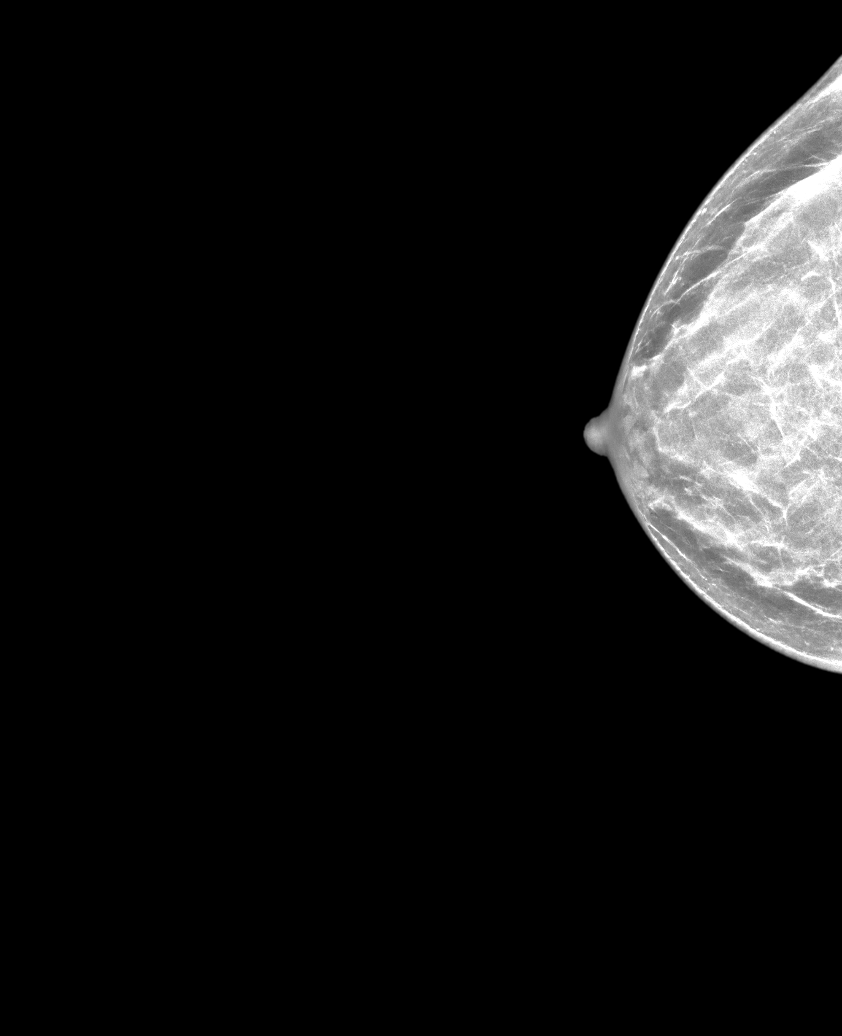

[R CC synth-2D]
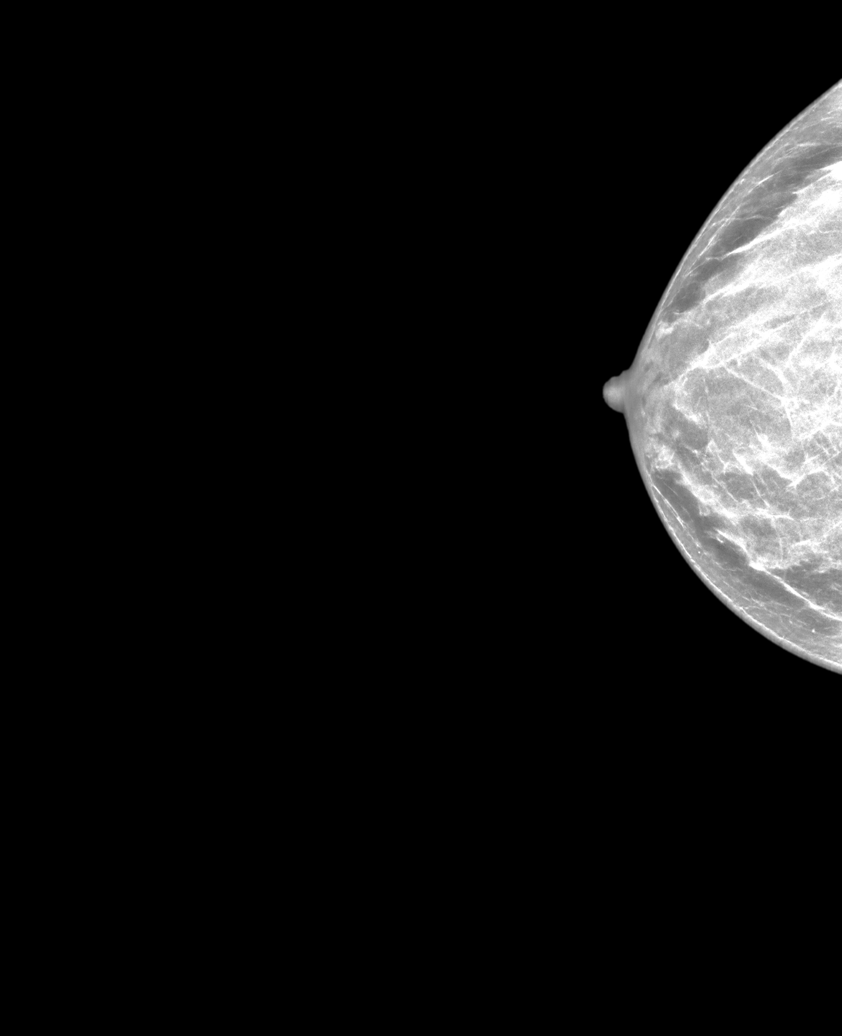

[L MLO synth-2D (1 of 2)]
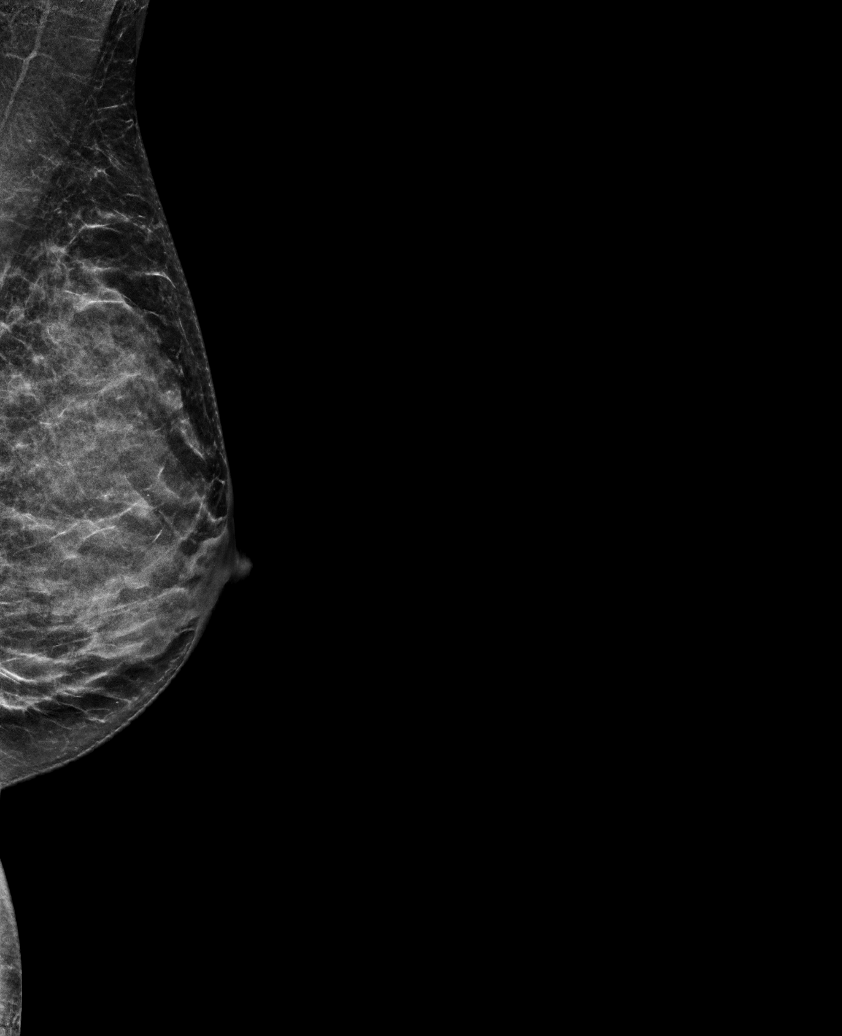

[L MLO synth-2D (2 of 2)]
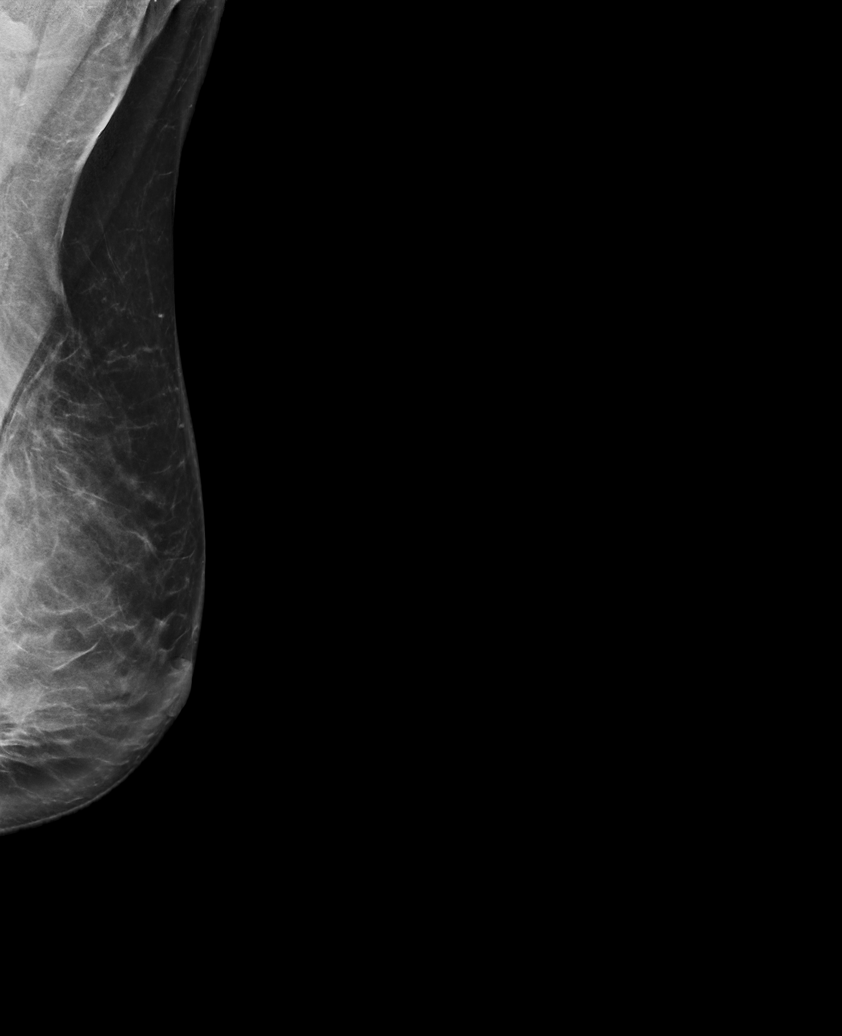

[R MLO synth-2D]
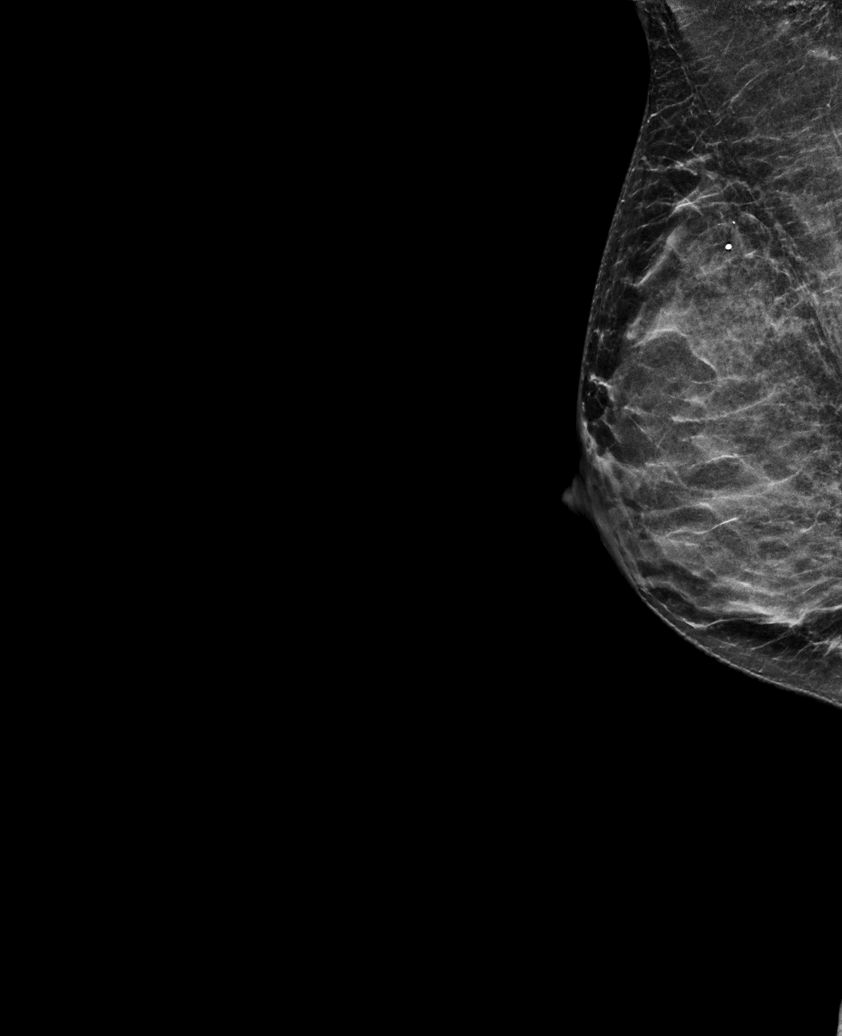

[6 of 36 positions shown; findings below may reference images not displayed]

ACR Breast Density Category d: The breast tissue is extremely dense,
which lowers the sensitivity of mammography
FINDINGS: There are no findings suspicious for malignancy.
IMPRESSION: No mammographic evidence of malignancy. A result letter of this
screening mammogram will be mailed directly to the patient.

RECOMMENDATION:
Screening mammogram in one year. (Code:TA-V-WV9)

BI-RADS CATEGORY  1: Negative.

## 2022-09-12 ENCOUNTER — Other Ambulatory Visit: Payer: Self-pay | Admitting: Family Medicine

## 2022-09-12 DIAGNOSIS — Z1231 Encounter for screening mammogram for malignant neoplasm of breast: Secondary | ICD-10-CM

## 2022-09-19 ENCOUNTER — Other Ambulatory Visit: Payer: Self-pay | Admitting: Radiology

## 2022-09-20 ENCOUNTER — Other Ambulatory Visit: Payer: Self-pay | Admitting: Family Medicine

## 2022-09-29 ENCOUNTER — Other Ambulatory Visit: Payer: Self-pay | Admitting: Family Medicine

## 2022-09-29 DIAGNOSIS — I10 Essential (primary) hypertension: Secondary | ICD-10-CM

## 2022-10-05 ENCOUNTER — Encounter: Payer: Self-pay | Admitting: Family Medicine

## 2022-10-05 ENCOUNTER — Ambulatory Visit (INDEPENDENT_AMBULATORY_CARE_PROVIDER_SITE_OTHER): Payer: Managed Care, Other (non HMO) | Admitting: Family Medicine

## 2022-10-05 VITALS — BP 159/79 | HR 62 | Ht 66.0 in | Wt 150.0 lb

## 2022-10-05 DIAGNOSIS — Z0000 Encounter for general adult medical examination without abnormal findings: Secondary | ICD-10-CM

## 2022-10-05 DIAGNOSIS — Z23 Encounter for immunization: Secondary | ICD-10-CM

## 2022-10-05 LAB — CBC
Hemoglobin: 12 g/dL (ref 11.7–15.5)
MCV: 88.5 fL (ref 80.0–100.0)
Platelets: 198 10*3/uL (ref 140–400)

## 2022-10-05 NOTE — Progress Notes (Signed)
Complete physical exam  Patient: Mariah Mckinney   DOB: July 27, 1960   62 y.o. Female  MRN: [id number]  Subjective:    Chief Complaint  Patient presents with   Annual Exam    Mariah Mckinney is a 62 y.o. female who presents today for a complete physical exam. She reports consuming a general diet.  Walking and and floor exercises.   She generally feels well.  She does have additional problems to discuss today.    Most recent fall risk assessment:    10/05/2022    9:21 AM  Fall Risk   Falls in the past year? 0  Number falls in past yr: 0  Injury with Fall? 0  Risk for fall due to : No Fall Risks  Follow up Falls evaluation completed     Most recent depression screenings:    10/05/2022    9:22 AM 10/03/2021    9:45 AM  PHQ 2/9 Scores  PHQ - 2 Score 0 0        Patient Care Team: Agapito Games, MD as PCP - General (Family Medicine)   Outpatient Medications Prior to Visit  Medication Sig   atorvastatin (LIPITOR) 20 MG tablet TAKE 1 TABLET (20 MG TOTAL) BY MOUTH DAILY AT 12 NOON.   CVS ASPIRIN ADULT LOW DOSE 81 MG chewable tablet TAKE 1 TABLET BY MOUTH EVERY DAY   Glycerin, PF, (OASIS TEARS PLUS PF) 0.22 % SOLN Apply to eye.   losartan (COZAAR) 100 MG tablet TAKE 1 TABLET BY MOUTH EVERY DAY   magnesium chloride (SLOW-MAG) 64 MG TBEC SR tablet Take 71.5 mg by mouth daily.   sotalol (BETAPACE) 80 MG tablet Take 1 tablet (80 mg total) by mouth 2 (two) times daily.   VITAMIN D PO Take 1 tablet by mouth daily.   [DISCONTINUED] Ergocalciferol (VITAMIN D2) 50 MCG (2000 UT) TABS Take by mouth.   No facility-administered medications prior to visit.    ROS        Objective:     BP (!) 159/79   Pulse 62   Ht  (1.676 m)   Wt 150 lb (68 kg)   LMP 11/03/2015   SpO2 100%   BMI 24.21 kg/m    Physical Exam Vitals and nursing note reviewed.  Constitutional:      Appearance: Normal appearance. She is well-developed.  HENT:     Head: Normocephalic  and atraumatic.     Right Ear: Tympanic membrane, ear canal and external ear normal.     Left Ear: Tympanic membrane, ear canal and external ear normal.     Nose: Nose normal.     Mouth/Throat:     Pharynx: Oropharynx is clear.  Eyes:     Conjunctiva/sclera: Conjunctivae normal.     Pupils: Pupils are equal, round, and reactive to light.  Neck:     Thyroid: No thyromegaly.  Cardiovascular:     Rate and Rhythm: Normal rate and regular rhythm.     Heart sounds: Normal heart sounds.  Pulmonary:     Effort: Pulmonary effort is normal.     Breath sounds: Normal breath sounds. No wheezing.  Abdominal:     General: Bowel sounds are normal.     Palpations: Abdomen is soft.  Musculoskeletal:     Cervical back: Neck supple.  Lymphadenopathy:     Cervical: No cervical adenopathy.  Skin:    General: Skin is warm and dry.     Coloration: Skin is  not pale.  Neurological:     General: No focal deficit present.     Mental Status: She is alert and oriented to person, place, and time.  Psychiatric:        Mood and Affect: Mood normal.        Behavior: Behavior normal.      No results found for any visits on 10/05/22.     Assessment & Plan:    Routine Health Maintenance and Physical Exam  Immunization History  Administered Date(s) Administered   Influenza Inj Mdck Quad Pf 03/17/2017, 03/25/2020, 03/25/2021   Influenza,inj,Quad PF,6+ Mos 04/22/2018, 03/03/2019, 03/23/2022   Influenza-Unspecified 03/13/2016, 04/16/2017, 04/22/2018   Janssen (J&J) SARS-COV-2 Vaccination 08/23/2019   PFIZER(Purple Top)SARS-COV-2 Vaccination 04/28/2020   Pfizer Covid-19 Vaccine Bivalent Booster 32yrs & up 11/01/2021   Tdap 01/05/2005, 12/13/2011   Zoster Recombinat (Shingrix) 10/05/2022    Health Maintenance  Topic Date Due   DTaP/Tdap/Td (3 - Td or Tdap) 12/12/2021   COVID-19 Vaccine (4 - 2023-24 season) 10/21/2023 (Originally 02/10/2022)   Zoster Vaccines- Shingrix (2 of 2) 11/30/2022   INFLUENZA  VACCINE  01/11/2023   MAMMOGRAM  09/23/2023   COLONOSCOPY (Pts 45-49yrs Insurance coverage will need to be confirmed)  01/09/2024   PAP SMEAR-Modifier  08/31/2024   Hepatitis C Screening  Completed   HIV Screening  Completed   Pneumococcal Vaccine 43-11 Years old  Aged Out   HPV VACCINES  Aged Out    Discussed health benefits of physical activity, and encouraged her to engage in regular exercise appropriate for her age and condition.  Problem List Items Addressed This Visit   None Visit Diagnoses     Wellness examination    -  Primary   Relevant Orders   Lipid Panel w/reflex Direct LDL   COMPLETE METABOLIC PANEL WITH GFR   CBC   TSH       Keep up a regular exercise program and make sure you are eating a healthy diet Try to eat 4 servings of dairy a day, or if you are lactose intolerant take a calcium with vitamin D daily.  Your vaccines are up to date.  First shingles vaccine given today. Mammogram scheduled for next month she will also come in for nurse visit at that time so that we can recheck her blood pressure.  Normally it looks better controlled.   No follow-ups on file.     Nani Gasser, MD

## 2022-10-06 LAB — COMPLETE METABOLIC PANEL WITH GFR
AG Ratio: 1.5 (calc) (ref 1.0–2.5)
ALT: 22 U/L (ref 6–29)
AST: 26 U/L (ref 10–35)
Albumin: 4.5 g/dL (ref 3.6–5.1)
Alkaline phosphatase (APISO): 100 U/L (ref 37–153)
BUN: 9 mg/dL (ref 7–25)
CO2: 27 mmol/L (ref 20–32)
Calcium: 10.1 mg/dL (ref 8.6–10.4)
Chloride: 102 mmol/L (ref 98–110)
Creat: 0.83 mg/dL (ref 0.50–1.05)
Globulin: 3.1 g/dL (calc) (ref 1.9–3.7)
Glucose, Bld: 92 mg/dL (ref 65–99)
Potassium: 4.4 mmol/L (ref 3.5–5.3)
Sodium: 137 mmol/L (ref 135–146)
Total Bilirubin: 0.5 mg/dL (ref 0.2–1.2)
Total Protein: 7.6 g/dL (ref 6.1–8.1)
eGFR: 81 mL/min/{1.73_m2} (ref >=60)

## 2022-10-06 LAB — CBC
HCT: 37.1 % (ref 35.0–45.0)
MCH: 28.6 pg (ref 27.0–33.0)
MCHC: 32.3 g/dL (ref 32.0–36.0)
MPV: 11.1 fL (ref 7.5–12.5)
RBC: 4.19 10*6/uL (ref 3.80–5.10)
RDW: 12.9 % (ref 11.0–15.0)
WBC: 4.3 10*3/uL (ref 3.8–10.8)

## 2022-10-06 LAB — LIPID PANEL W/REFLEX DIRECT LDL
Cholesterol: 173 mg/dL (ref <200)
HDL: 65 mg/dL (ref >=50)
LDL Cholesterol (Calc): 95 mg/dL (calc)
Non-HDL Cholesterol (Calc): 108 mg/dL (calc) (ref <130)
Total CHOL/HDL Ratio: 2.7 (calc) (ref <5.0)
Triglycerides: 45 mg/dL (ref <150)

## 2022-10-06 LAB — TSH: TSH: 1.17 mIU/L (ref 0.40–4.50)

## 2022-10-06 NOTE — Progress Notes (Signed)
Your lab work is within acceptable range and there are no concerning findings.   ?

## 2022-10-11 ENCOUNTER — Ambulatory Visit: Payer: Managed Care, Other (non HMO)

## 2022-10-19 DIAGNOSIS — Z950 Presence of cardiac pacemaker: Secondary | ICD-10-CM | POA: Insufficient documentation

## 2022-10-23 ENCOUNTER — Telehealth: Payer: Self-pay

## 2022-10-23 NOTE — Transitions of Care (Post Inpatient/ED Visit) (Signed)
   10/23/2022  Name: Mariah Mckinney MRN: [id number] DOB: January 08, 1961  Today's TOC FU Call Status: Today's TOC FU Call Status:: Successful TOC FU Call Competed TOC FU Call Complete Date: 10/23/22  Transition Care Management Follow-up Telephone Call Date of Discharge: 10/21/22 Discharge Facility: Other ([facility]) Name of Other ([facility]charge Facility: [facility] Type of Discharge: Inpatient Admission Primary Inpatient Discharge Diagnosis:: cardiogenic How have you been since you were released from the hospital?: Better Any questions or concerns?: No  Items Reviewed: Did you receive and understand the discharge instructions provided?: Yes Medications obtained,verified, and reconciled?: Yes (Medications Reviewed) Any new allergies since your discharge?: No Dietary orders reviewed?: Yes Do you have support at home?: Yes People in Home: spouse  Medications Reviewed Today: Medications Reviewed Today     Reviewed by Karena Addison, LPN (Licensed Practical Nurse) on 10/23/22 at 1049  Med List Status: <None>   Medication Order Taking? Sig Documenting Provider Last Dose Status Informant  atorvastatin (LIPITOR) 20 MG tablet [id number] Yes TAKE 1 TABLET (20 MG TOTAL) BY MOUTH DAILY AT 12 NOON. Agapito Games, MD Taking Active   CVS ASPIRIN ADULT LOW DOSE 81 MG chewable tablet [id number] Yes TAKE 1 TABLET BY MOUTH EVERY DAY Agapito Games, MD Taking Active   Glycerin, PF, (OASIS TEARS PLUS PF) 0.22 % SOLN [id number] Yes Apply to eye. [provider] Taking Active   losartan (COZAAR) 100 MG tablet [id number] Yes TAKE 1 TABLET BY MOUTH EVERY DAY Agapito Games, MD Taking Active   magnesium chloride (SLOW-MAG) 64 MG TBEC SR tablet [id number] Yes Take 71.5 mg by mouth daily. [provider] Taking Active   metoprolol tartrate (LOPRESSOR) 50 MG tablet [id number] Yes Take 50 mg by mouth 2 (two) times daily. [provider] Taking Active   sotalol  (BETAPACE) 80 MG tablet [id number] No Take 1 tablet (80 mg total) by mouth 2 (two) times daily.  Patient not taking: Reported on 10/23/2022   Agapito Games, MD Not Taking Active   VITAMIN D PO [id number] Yes Take 1 tablet by mouth daily. [provider] Taking Active             Home Care and Equipment/Supplies: Were Home Health Services Ordered?: NA Any new equipment or medical supplies ordered?: NA  Functional Questionnaire: Do you need assistance with bathing/showering or dressing?: No Do you need assistance with meal preparation?: No Do you need assistance with eating?: No Do you have difficulty maintaining continence: No Do you need assistance with getting out of bed/getting out of a chair/moving?: No Do you have difficulty managing or taking your medications?: No  Follow up appointments reviewed: PCP Follow-up appointment confirmed?: NA Specialist Hospital Follow-up appointment confirmed?: Yes Date of Specialist follow-up appointment?: 11/07/22 Follow-Up Specialty Provider:: cardio Do you need transportation to your follow-up appointment?: No Do you understand care options if your condition(s) worsen?: Yes-patient verbalized understanding    SIGNATURE Karena Addison, LPN [facility] Nurse Health Advisor Direct Dial [phone]

## 2022-10-25 ENCOUNTER — Ambulatory Visit: Payer: Managed Care, Other (non HMO)

## 2023-03-07 ENCOUNTER — Telehealth: Payer: Self-pay | Admitting: General Practice

## 2023-03-07 NOTE — Transitions of Care (Post Inpatient/ED Visit) (Signed)
03/07/2023  Name: Mariah Mckinney MRN: [id number] DOB: 11-05-1960  Today's TOC FU Call Status: Today's TOC FU Call Status:: Successful TOC FU Call Completed TOC FU Call Complete Date: 03/07/23 Patient's Name and Date of Birth confirmed.  Transition Care Management Follow-up Telephone Call Date of Discharge: 03/06/23 Discharge Facility: Other [facility]) [facility] Type of Discharge: Emergency Department Reason for ED Visit: Cardiac Conditions Cardiac Conditions Diagnosis: Chest Pain Persisting How have you been since you were released from the hospital?: Better Any questions or concerns?: No  Items Reviewed: Did you receive and understand the discharge instructions provided?: Yes Medications obtained,verified, and reconciled?: Yes (Medications Reviewed) Any new allergies since your discharge?: No Dietary orders reviewed?: NA Do you have support at home?: Yes  Medications Reviewed Today: Medications Reviewed Today     Reviewed by Modesto Charon, RN (Registered Nurse) on 03/07/23 at 1113  Med List Status: <None>   Medication Order Taking? Sig Documenting Provider Last Dose Status Informant  atorvastatin (LIPITOR) 20 MG tablet [id number] No TAKE 1 TABLET (20 MG TOTAL) BY MOUTH DAILY AT 12 NOON. Agapito Games, MD Taking Active   CVS ASPIRIN ADULT LOW DOSE 81 MG chewable tablet [id number] No TAKE 1 TABLET BY MOUTH EVERY DAY Agapito Games, MD Taking Active   Glycerin, PF, (OASIS TEARS PLUS PF) 0.22 % SOLN [id number] No Apply to eye. [provider] Taking Active   losartan (COZAAR) 100 MG tablet [id number] No TAKE 1 TABLET BY MOUTH EVERY DAY Agapito Games, MD Taking Active   magnesium chloride (SLOW-MAG) 64 MG TBEC SR tablet [id number] No Take 71.5 mg by mouth daily. [provider] Taking Active   sotalol (BETAPACE) 80 MG tablet [id number] No Take 1 tablet (80 mg total)  by mouth 2 (two) times daily.  Patient not taking: Reported on 10/23/2022   Agapito Games, MD Not Taking Active   VITAMIN D PO [id number] No Take 1 tablet by mouth daily. [provider] Taking Active             Home Care and Equipment/Supplies: Were Home Health Services Ordered?: NA Any new equipment or medical supplies ordered?: NA  Functional Questionnaire: Do you need assistance with bathing/showering or dressing?: No Do you need assistance with meal preparation?: No Do you need assistance with eating?: No Do you have difficulty maintaining continence: No Do you need assistance with getting out of bed/getting out of a chair/moving?: No Do you have difficulty managing or taking your medications?: No  Follow up appointments reviewed: PCP Follow-up appointment confirmed?: Yes Date of PCP follow-up appointment?: 03/13/23 Follow-up Provider: Dr. Linford Arnold Specialist [facility] Follow-up appointment confirmed?: No Follow-Up Specialty Provider:: Endocrinology Reason Specialist Follow-Up Not Confirmed: Patient has Specialist Provider Number and will Call for Appointment Do you need transportation to your follow-up appointment?: No Do you understand care options if your condition(s) worsen?: Yes-patient verbalized understanding  SDOH Interventions Today    Flowsheet Row Most Recent Value  SDOH Interventions   Transportation Interventions Intervention Not Indicated       SIGNATURE Modesto Charon, RN BSN Nurse Health Advisor

## 2023-03-08 ENCOUNTER — Other Ambulatory Visit: Payer: Self-pay | Admitting: Family Medicine

## 2023-03-13 ENCOUNTER — Ambulatory Visit (INDEPENDENT_AMBULATORY_CARE_PROVIDER_SITE_OTHER): Payer: Managed Care, Other (non HMO) | Admitting: Family Medicine

## 2023-03-13 ENCOUNTER — Encounter: Payer: Self-pay | Admitting: Family Medicine

## 2023-03-13 VITALS — BP 132/62 | HR 69 | Ht 66.0 in | Wt 147.0 lb

## 2023-03-13 DIAGNOSIS — I10 Essential (primary) hypertension: Secondary | ICD-10-CM

## 2023-03-13 DIAGNOSIS — Z23 Encounter for immunization: Secondary | ICD-10-CM | POA: Diagnosis not present

## 2023-03-13 DIAGNOSIS — E0590 Thyrotoxicosis, unspecified without thyrotoxic crisis or storm: Secondary | ICD-10-CM

## 2023-03-13 MED ORDER — LOSARTAN POTASSIUM-HCTZ 100-25 MG PO TABS
1.0000 | ORAL_TABLET | Freq: Every day | ORAL | 1 refills | Status: DC
Start: 2023-03-13 — End: 2023-06-15

## 2023-03-13 NOTE — Progress Notes (Signed)
Established Patient Office Visit  Subjective   Patient ID: Mariah Mckinney, female    DOB: 01-Jan-1961  Age: 62 y.o. MRN: [id number]  Chief Complaint  Patient presents with   Follow-up    HPI She is here today for follow-up for recent episode of chest pain.  [facility] for lightheadedness, palpitations and elevated blood pressure, 214/97.  She was found to have an undetectable TSH and elevated free T4.  She does have a pacemaker in place.  She does not take biotin or lithium.  She had been on a slow taper of prednisone for some leg swelling.  TSH 0.450 - 5.330 uIU/mL 4.811 3.320     <0.050 (L)  FREE T4 0.6 - 1.3 ng/dL 1.5 (H)        1.1 3.6 (H)  Hemoglobin     11.0 (*L)   Endocrinology was consulted and these are their following recommendations: - Would recommend starting methimazole 10 mg daily. Side effects of this medication discussed with her today.  - Add on TPO and thyrotropin receptor antibodies - Repeat Free T4 and Total T3 this Friday 9/27 - I have ordered this - Will arrange for outpatient follow-up with our clinic  Echocardiogram done in the ED showed a EF of 55 to 60% with normal left ventricular function.  Mild dilatation of the left atrium and mild aortic regurg and mild to moderate mitral regurg.  Has been taking her BP at home.  Brought in home blood pressure log.  Most of them are ranging between 1 40-1 99 at home.  He says before this all started her blood pressures were typically running in the 130s to maybe low 140s.    ROS    Objective:     BP 132/62   Pulse 69   Ht 5\' 6"  (1.676 m)   Wt 147 lb (66.7 kg)   LMP 11/03/2015   SpO2 99%   BMI 23.73 kg/m    Physical Exam Vitals and nursing note reviewed.  Constitutional:      Appearance: Normal appearance.  HENT:     Head: Normocephalic and atraumatic.  Eyes:     Conjunctiva/sclera: Conjunctivae normal.  Neck:     Comments: Left side of gland feels  larger than the right.   Cardiovascular:     Rate and Rhythm: Normal rate and regular rhythm.  Pulmonary:     Effort: Pulmonary effort is normal.     Breath sounds: Normal breath sounds.  Musculoskeletal:     Cervical back: Neck supple. No tenderness.     Comments: Still some trace swelling of feet.    Skin:    General: Skin is warm and dry.  Neurological:     Mental Status: She is alert.  Psychiatric:        Mood and Affect: Mood normal.    No results found for any visits on 03/13/23.    The 10-year ASCVD risk score (Arnett DK, et al., 2019) is: 5.9%    Assessment & Plan:   Problem List Items Addressed This Visit       Cardiovascular and Mediastinum   Essential hypertension - Primary    Add hydrochlorothiazide to her BP regimen. We also can go up on the metoprolol if needed. F/U in 2-3 weeks for nurse visit. Will check BMP at tht time.      Relevant Medications   metoprolol tartrate (LOPRESSOR) 50 MG tablet   losartan-hydrochlorothiazide (HYZAAR) 100-25  MG tablet   Other Visit Diagnoses     Hyperthyroidism       Relevant Medications   methimazole (TAPAZOLE) 10 MG tablet   metoprolol tartrate (LOPRESSOR) 50 MG tablet   Other Relevant Orders   US THYROID   Encounter for immunization       Relevant Orders   Varicella-zoster vaccine IM (Completed)       Hypothyroidism-she is taking the methimazole and had actually already been on the metoprolol per the cardiologist.  She is feeling somewhat better she is not noticing as many palpitations.  She does not have an appoint with endocrinology until the end of the year but she did go for labs that they had ordered on Friday. I encouraged her to call their office to see if she should be seen sooner Her foot welling has been better with the steroids  I do palpate a nodule on the left side fo her thyroid gland. Will schedule for [place].   I spent 35 minutes on the day of the encounter to include pre-visit record review,  face-to-face time with the patient and post visit ordering of test.   Return in about 2 weeks (around 03/27/2023) for Nurse BP Check.    Nani Gasser, MD

## 2023-03-13 NOTE — Assessment & Plan Note (Signed)
Add hydrochlorothiazide to her BP regimen. We also can go up on the metoprolol if needed. F/U in 2-3 weeks for nurse visit. Will check BMP at tht time.

## 2023-03-15 ENCOUNTER — Ambulatory Visit (INDEPENDENT_AMBULATORY_CARE_PROVIDER_SITE_OTHER): Payer: Managed Care, Other (non HMO)

## 2023-03-15 DIAGNOSIS — E0590 Thyrotoxicosis, unspecified without thyrotoxic crisis or storm: Secondary | ICD-10-CM | POA: Diagnosis not present

## 2023-03-20 NOTE — Progress Notes (Signed)
Hi Mariah Mckinney, ultrasound of the thyroid overall looks good.  No discrete nodules or increased vascularity which is great.  No overall enlargement of the gland.

## 2023-03-26 ENCOUNTER — Other Ambulatory Visit: Payer: Self-pay

## 2023-03-26 DIAGNOSIS — I10 Essential (primary) hypertension: Secondary | ICD-10-CM

## 2023-03-27 ENCOUNTER — Ambulatory Visit (INDEPENDENT_AMBULATORY_CARE_PROVIDER_SITE_OTHER): Payer: Managed Care, Other (non HMO)

## 2023-03-27 VITALS — BP 113/63 | HR 64 | Ht 66.0 in

## 2023-03-27 DIAGNOSIS — Z23 Encounter for immunization: Secondary | ICD-10-CM | POA: Diagnosis not present

## 2023-03-27 DIAGNOSIS — I10 Essential (primary) hypertension: Secondary | ICD-10-CM

## 2023-03-27 NOTE — Progress Notes (Signed)
   Established Patient Office Visit  Subjective   Patient ID: Mariah Mckinney, female    DOB: 11-10-1960  Age: 62 y.o. MRN: [id number]  Chief Complaint  Patient presents with   Hypertension    BP check     HPI  Hypertension- BP check . Patient denies chest pain, shortness of breath, palpitations or medication problems. She does state that she has occasional dizziness related to thryoid issues.   ROS    Objective:     BP 113/63   Pulse 64   Ht 5\' 6"  (1.676 m)   LMP 11/03/2015   SpO2 100%   BMI 23.73 kg/m    Physical Exam   No results found for any visits on 03/27/23.    The 10-year ASCVD risk score (Arnett DK, et al., 2019) is: 3.8%    Assessment & Plan:  BP check -reading =113/63. Per Dr. Linford Arnold continue current medication regimen and return in 6 months for follow up with Dr. Linford Arnold.  Problem List Items Addressed This Visit       Cardiovascular and Mediastinum   Essential hypertension - Primary   Other Visit Diagnoses     Encounter for immunization       Relevant Orders   Flu vaccine trivalent PF, 6mos and older(Flulaval,Afluria,Fluarix,Fluzone) (Completed)       Return in about 6 months (around 09/25/2023) for with Dr. Linford Arnold for HTN follow up.    Elizabeth Palau, LPN

## 2023-03-28 LAB — BMP8+EGFR
BUN/Creatinine Ratio: 11 — ABNORMAL LOW (ref 12–28)
BUN: 11 mg/dL (ref 8–27)
CO2: 23 mmol/L (ref 20–29)
Calcium: 10 mg/dL (ref 8.7–10.3)
Chloride: 100 mmol/L (ref 96–106)
Creatinine, Ser: 0.99 mg/dL (ref 0.57–1.00)
Glucose: 69 mg/dL — ABNORMAL LOW (ref 70–99)
Potassium: 4.4 mmol/L (ref 3.5–5.2)
Sodium: 137 mmol/L (ref 134–144)
eGFR: 65 mL/min/{1.73_m2} (ref >59)

## 2023-03-28 NOTE — Progress Notes (Signed)
Your lab work is within acceptable range and there are no concerning findings.   ?

## 2023-03-29 ENCOUNTER — Other Ambulatory Visit: Payer: Self-pay | Admitting: Family Medicine

## 2023-03-29 DIAGNOSIS — I10 Essential (primary) hypertension: Secondary | ICD-10-CM

## 2023-06-03 ENCOUNTER — Other Ambulatory Visit: Payer: Self-pay | Admitting: Family Medicine

## 2023-06-03 DIAGNOSIS — I10 Essential (primary) hypertension: Secondary | ICD-10-CM

## 2023-06-11 ENCOUNTER — Other Ambulatory Visit: Payer: Self-pay | Admitting: Family Medicine

## 2023-06-11 DIAGNOSIS — I10 Essential (primary) hypertension: Secondary | ICD-10-CM

## 2023-07-11 ENCOUNTER — Ambulatory Visit: Payer: Managed Care, Other (non HMO)

## 2023-07-11 DIAGNOSIS — Z1231 Encounter for screening mammogram for malignant neoplasm of breast: Secondary | ICD-10-CM

## 2023-07-13 ENCOUNTER — Encounter: Payer: Self-pay | Admitting: Family Medicine

## 2023-07-13 ENCOUNTER — Other Ambulatory Visit: Payer: Self-pay | Admitting: Family Medicine

## 2023-07-13 DIAGNOSIS — R928 Other abnormal and inconclusive findings on diagnostic imaging of breast: Secondary | ICD-10-CM

## 2023-07-13 NOTE — Progress Notes (Signed)
Mariah Mckinney, your mammogram showed a questionable area in the left breast.  The imaging department will be contacting you for follow-up imaging.

## 2023-07-20 ENCOUNTER — Other Ambulatory Visit: Payer: Managed Care, Other (non HMO)

## 2023-07-20 ENCOUNTER — Encounter: Payer: Self-pay | Admitting: Family Medicine

## 2023-07-20 ENCOUNTER — Ambulatory Visit: Admission: RE | Admit: 2023-07-20 | Payer: Managed Care, Other (non HMO) | Source: Ambulatory Visit

## 2023-07-20 ENCOUNTER — Ambulatory Visit
Admission: RE | Admit: 2023-07-20 | Discharge: 2023-07-20 | Disposition: A | Discharge status: Home / Self Care | Payer: Managed Care, Other (non HMO) | Source: Ambulatory Visit | Attending: Family Medicine | Admitting: Family Medicine

## 2023-07-20 DIAGNOSIS — R928 Other abnormal and inconclusive findings on diagnostic imaging of breast: Secondary | ICD-10-CM

## 2023-07-20 NOTE — Progress Notes (Signed)
 Please call patient. Normal mammogram.  Repeat in 1 year.

## 2023-07-30 ENCOUNTER — Other Ambulatory Visit: Payer: Managed Care, Other (non HMO)

## 2023-09-24 ENCOUNTER — Encounter: Payer: Self-pay | Admitting: Family Medicine

## 2023-09-24 ENCOUNTER — Ambulatory Visit: Payer: Managed Care, Other (non HMO) | Admitting: Family Medicine

## 2023-09-24 VITALS — BP 126/72 | HR 72 | Ht 66.0 in | Wt 150.0 lb

## 2023-09-24 DIAGNOSIS — E785 Hyperlipidemia, unspecified: Secondary | ICD-10-CM | POA: Diagnosis not present

## 2023-09-24 DIAGNOSIS — Z0000 Encounter for general adult medical examination without abnormal findings: Secondary | ICD-10-CM | POA: Diagnosis not present

## 2023-09-24 DIAGNOSIS — Z23 Encounter for immunization: Secondary | ICD-10-CM

## 2023-09-24 DIAGNOSIS — E559 Vitamin D deficiency, unspecified: Secondary | ICD-10-CM | POA: Diagnosis not present

## 2023-09-24 DIAGNOSIS — I10 Essential (primary) hypertension: Secondary | ICD-10-CM

## 2023-09-24 NOTE — Progress Notes (Signed)
 Established Patient Office Visit  Subjective  Patient ID: Mariah Mckinney, female    DOB: 11-Jun-1961  Age: 63 y.o. MRN: [id number]  Chief Complaint  Patient presents with   cpe    HPI  Here for CPE.    Hypertension- Pt denies chest pain, SOB, dizziness, or heart palpitations.  Taking meds as directed w/o problems.  Denies medication side effects.    She also follows with Dr. Manley Mason for her Luiz Blare' disease at [facility].  They did do some recent labs in March.  Mammogram was in January she did have a recall but the recall came back normal.  She has her colonoscopy scheduled for May.    ROS    Objective:     BP 138/69   Pulse 72   Ht 5\' 6"  (1.676 m)   Wt 150 lb (68 kg)   LMP 11/03/2015   SpO2 100%   BMI 24.21 kg/m    Physical Exam Vitals and nursing note reviewed. Exam conducted with a chaperone present.  Constitutional:      Appearance: Normal appearance.  HENT:     Head: Normocephalic and atraumatic.     Right Ear: Tympanic membrane, ear canal and external ear normal. There is no impacted cerumen.     Left Ear: Tympanic membrane, ear canal and external ear normal. There is no impacted cerumen.     Nose: Nose normal.     Mouth/Throat:     Pharynx: Oropharynx is clear.  Eyes:     Extraocular Movements: Extraocular movements intact.     Conjunctiva/sclera: Conjunctivae normal.     Pupils: Pupils are equal, round, and reactive to light.  Neck:     Thyroid: No thyromegaly.  Cardiovascular:     Rate and Rhythm: Normal rate and regular rhythm.  Pulmonary:     Effort: Pulmonary effort is normal.     Breath sounds: Normal breath sounds.  Chest:     Chest wall: No mass.  Breasts:    Right: Normal. No mass, nipple discharge or skin change.     Left: Normal. No mass, nipple discharge or skin change.  Abdominal:     General: Bowel sounds are normal.     Palpations: Abdomen is soft.     Tenderness: There is no abdominal tenderness.  Musculoskeletal:         General: No swelling.     Cervical back: Neck supple. No tenderness.  Lymphadenopathy:     Cervical: No cervical adenopathy.     Upper Body:     Right upper body: No supraclavicular, axillary or pectoral adenopathy.     Left upper body: No supraclavicular, axillary or pectoral adenopathy.  Skin:    General: Skin is warm and dry.  Neurological:     Mental Status: She is alert and oriented to person, place, and time.  Psychiatric:        Mood and Affect: Mood normal.        Behavior: Behavior normal.      No results found for any visits on 09/24/23.    The ASCVD Risk score (Arnett DK, et al., 2019) failed to calculate for the following reasons:   Risk score cannot be calculated because patient has a medical history suggesting prior/existing ASCVD    Assessment & Plan:   Problem List Items Addressed This Visit       Cardiovascular and Mediastinum   Essential hypertension   Relevant Orders   CMP14+EGFR  Other   Hyperlipidemia   Relevant Orders   Lipid panel   Other Visit Diagnoses       Wellness examination    -  Primary   Relevant Orders   Lipid panel   CMP14+EGFR   CBC   VITAMIN D 25 Hydroxy (Vit-D Deficiency, Fractures)     Encounter for immunization       Relevant Orders   Tdap vaccine greater than or equal to 7yo IM (Completed)     Vitamin D deficiency       Relevant Orders   VITAMIN D 25 Hydroxy (Vit-D Deficiency, Fractures)       Keep up a regular exercise program and make sure you are eating a healthy diet Try to eat 4 servings of dairy a day, or if you are lactose intolerant take a calcium with vitamin D daily.  Your vaccines are up to date.   Recommend Tdap vaccine to get updated today.  No follow-ups on file.    Duaine German, MD

## 2023-09-25 ENCOUNTER — Encounter: Payer: Self-pay | Admitting: Family Medicine

## 2023-09-25 LAB — CMP14+EGFR
ALT: 20 IU/L (ref 0–32)
AST: 26 IU/L (ref 0–40)
Albumin: 4.4 g/dL (ref 3.9–4.9)
Alkaline Phosphatase: 208 IU/L — ABNORMAL HIGH (ref 44–121)
BUN/Creatinine Ratio: 10 — ABNORMAL LOW (ref 12–28)
BUN: 10 mg/dL (ref 8–27)
Bilirubin Total: 0.5 mg/dL (ref 0.0–1.2)
CO2: 22 mmol/L (ref 20–29)
Calcium: 10.3 mg/dL (ref 8.7–10.3)
Chloride: 97 mmol/L (ref 96–106)
Creatinine, Ser: 0.97 mg/dL (ref 0.57–1.00)
Globulin, Total: 3 g/dL (ref 1.5–4.5)
Glucose: 84 mg/dL (ref 70–99)
Potassium: 4.4 mmol/L (ref 3.5–5.2)
Sodium: 134 mmol/L (ref 134–144)
Total Protein: 7.4 g/dL (ref 6.0–8.5)
eGFR: 66 mL/min/{1.73_m2} (ref >59)

## 2023-09-25 LAB — CBC
Hematocrit: 35 % (ref 34.0–46.6)
Hemoglobin: 11.5 g/dL (ref 11.1–15.9)
MCH: 27.7 pg (ref 26.6–33.0)
MCHC: 32.9 g/dL (ref 31.5–35.7)
MCV: 84 fL (ref 79–97)
Platelets: 221 10*3/uL (ref 150–450)
RBC: 4.15 x10E6/uL (ref 3.77–5.28)
RDW: 12.2 % (ref 11.7–15.4)
WBC: 4.7 10*3/uL (ref 3.4–10.8)

## 2023-09-25 LAB — LIPID PANEL
Chol/HDL Ratio: 2.6 ratio (ref 0.0–4.4)
Cholesterol, Total: 168 mg/dL (ref 100–199)
HDL: 64 mg/dL (ref >39)
LDL Chol Calc (NIH): 96 mg/dL (ref 0–99)
Triglycerides: 38 mg/dL (ref 0–149)
VLDL Cholesterol Cal: 8 mg/dL (ref 5–40)

## 2023-09-25 LAB — VITAMIN D 25 HYDROXY (VIT D DEFICIENCY, FRACTURES): Vit D, 25-Hydroxy: 54.1 ng/mL (ref 30.0–100.0)

## 2023-09-25 NOTE — Progress Notes (Signed)
 Hi Margan, your alkaline phosphatase level is elevated normal is at 221 years is 208.  It was a little elevated back in December but not quite this high.  I do not know if you member what we did an MRI on your abdomen and liver back in 2018.  You had a couple of stable lesions in the liver.  I would like to repeat that MRI since it has been several years just to make sure everything is again stable if you are okay with that then please let me know.

## 2023-10-18 LAB — HM COLONOSCOPY

## 2023-10-23 ENCOUNTER — Other Ambulatory Visit: Payer: Self-pay | Admitting: Family Medicine

## 2023-11-27 ENCOUNTER — Ambulatory Visit: Payer: Self-pay

## 2023-11-27 NOTE — Telephone Encounter (Signed)
 Pt is scheduled for an OV 6/18.

## 2023-11-27 NOTE — Telephone Encounter (Signed)
 FYI Only or Action Required?: FYI only for provider  Patient was last seen in primary care on 09/24/2023 by Cydney Draft, MD. Called Nurse Triage reporting No chief complaint on file.. Symptoms began several days ago. Interventions attempted: Nothing. Symptoms are: gradually worsening.  Triage Disposition: See Physician Within 24 Hours  Patient/caregiver understands and will follow disposition?: Yes  Copied from CRM [id number]. Topic: Clinical - Red Word Triage >> Nov 27, 2023  8:26 AM Maryln Sober wrote: Red Word that prompted transfer to Nurse Triage: Patient states since Friday she has been having some light headedness and blurred vision Reason for Disposition  [1] MODERATE dizziness (e.g., interferes with normal activities) AND [2] has NOT been evaluated by doctor (or NP/PA) for this  (Exception: Dizziness caused by heat exposure, sudden standing, or poor fluid intake.)  Answer Assessment - Initial Assessment Questions 1. DESCRIPTION: Describe your dizziness.     Lightheadedness  2. LIGHTHEADED: Do you feel lightheaded? (e.g., somewhat faint, woozy, weak upon standing)     Weakness,  3. VERTIGO: Do you feel like either you or the room is spinning or tilting? (i.e. vertigo)     No  4. SEVERITY: How bad is it?  Do you feel like you are going to faint? Can you stand and walk?   - MILD: Feels slightly dizzy, but walking normally.   - MODERATE: Feels unsteady when walking, but not falling; interferes with normal activities (e.g., school, work).   - SEVERE: Unable to walk without falling, or requires assistance to walk without falling; feels like passing out now.      Mild to Moderate  5. ONSET:  When did the dizziness begin?     Since Friday  6. AGGRAVATING FACTORS: Does anything make it worse? (e.g., standing, change in head position)     Unsure  7. HEART RATE: Can you tell me your heart rate? How many beats in 15 seconds?  (Note: not all patients can do this)        No  8. CAUSE: What do you think is causing the dizziness?     Unsure  9. RECURRENT SYMPTOM: Have you had dizziness before? If Yes, ask: When was the last time? What happened that time?     No  10. OTHER SYMPTOMS: Do you have any other symptoms? (e.g., fever, chest pain, vomiting, diarrhea, bleeding)       Headaches, Blurred Vision or Double Vision  11. PREGNANCY: Is there any chance you are pregnant? When was your last menstrual period?       No and No  Protocols used: Dizziness - Lightheadedness-A-AH

## 2023-11-28 ENCOUNTER — Ambulatory Visit: Admitting: Physician Assistant

## 2023-11-29 ENCOUNTER — Ambulatory Visit: Admitting: Family Medicine

## 2023-11-29 ENCOUNTER — Encounter: Payer: Self-pay | Admitting: Family Medicine

## 2023-11-29 VITALS — BP 128/64 | HR 65 | Ht 66.0 in | Wt 152.1 lb

## 2023-11-29 DIAGNOSIS — E871 Hypo-osmolality and hyponatremia: Secondary | ICD-10-CM | POA: Diagnosis not present

## 2023-11-29 DIAGNOSIS — E0500 Thyrotoxicosis with diffuse goiter without thyrotoxic crisis or storm: Secondary | ICD-10-CM | POA: Insufficient documentation

## 2023-11-29 DIAGNOSIS — I10 Essential (primary) hypertension: Secondary | ICD-10-CM | POA: Diagnosis not present

## 2023-11-29 DIAGNOSIS — H532 Diplopia: Secondary | ICD-10-CM | POA: Diagnosis not present

## 2023-11-29 DIAGNOSIS — R42 Dizziness and giddiness: Secondary | ICD-10-CM

## 2023-11-29 NOTE — Assessment & Plan Note (Signed)
 Endocrinologist felt like her levels were within range she is still taking methimazole 5 mg.  She has not recently had any medication changes or additions and she has not started any new supplements.

## 2023-11-29 NOTE — Progress Notes (Signed)
 Acute Office Visit  Subjective:     Patient ID: AMBIKA ZETTLEMOYER, female    DOB: 06-04-1961, 63 y.o.   MRN: [id number]  Chief Complaint  Patient presents with   Dizziness    HPI Patient is in today for dizziness that started on 6/13 at doctors office. BP was .173/85  that day.  Stated with a HA, elevated BP,  and then had double vision. Notices vision issue earlier in the mornings.  She feel lightheaded. Says she doesn't feel dizzy.    Last vision exam in March 40981  She just saw her endocrinologist for her Murrell Arrant' disease and had labs.  Her sodium was low at 130, chloride was off just by 1 point at 97, alkaline phosphatase was slightly elevated at 138 but additional liver enzymes were normal.  The alkaline phosphatase was actually better than it was back in April.  Calcium  levels were normal.  TSH was low at 0.05, with normal free T4 and total T3.  Hemoglobin was just a little bit low at 12.  Endocrine did order some screen for primary aldosteronism.  No new medication. No recent URI.  Som mild nasal congestion.    Double vision-she says that when she tries to read the first line is normal and it is horizontal the second line actually looks like it is coming out an angle.  No recent head trauma or injury.  ROS      Objective:    BP 128/64   Pulse 65   Ht 5' 6 (1.676 m)   Wt 152 lb 1.9 oz (69 kg)   LMP 11/03/2015   SpO2 100%   BMI 24.55 kg/m    Physical Exam Vitals and nursing note reviewed.  Constitutional:      Appearance: Normal appearance.  HENT:     Head: Normocephalic and atraumatic.   Eyes:     Conjunctiva/sclera: Conjunctivae normal.    Cardiovascular:     Rate and Rhythm: Normal rate and regular rhythm.  Pulmonary:     Effort: Pulmonary effort is normal.     Breath sounds: Normal breath sounds.   Skin:    General: Skin is warm and dry.   Neurological:     Mental Status: She is alert.   Psychiatric:        Mood and Affect: Mood normal.      No results found for any visits on 11/29/23.      Assessment & Plan:   Problem List Items Addressed This Visit       Cardiovascular and Mediastinum   Essential hypertension   Her blood pressure looks good today and it is down from when she was at the endocrinologist office so I would suspect that if her symptoms and headache etc. referral from uncontrolled blood pressure that it would have improved greatly by today.  Her headache is better but she still having the double vision and the lightheadedness.  She denies any room spinning.      Relevant Orders   Ambulatory referral to Ophthalmology   Basic Metabolic Panel (BMET)     Endocrine   Graves disease   Endocrinologist felt like her levels were within range she is still taking methimazole 5 mg.  She has not recently had any medication changes or additions and she has not started any new supplements.      Relevant Medications   methimazole (TAPAZOLE) 5 MG tablet   Other Relevant Orders   Ambulatory referral to Ophthalmology  Basic Metabolic Panel (BMET)   Other Visit Diagnoses       Double vision    -  Primary     Hyponatremia       Relevant Orders   Ambulatory referral to Ophthalmology   Basic Metabolic Panel (BMET)     Lightheadedness       Relevant Orders   EKG 12-Lead   Ambulatory referral to Ophthalmology   Basic Metabolic Panel (BMET)      Hyponatremia-this has not been a persistent issue for her and seems to be new and like to recheck that today just to confirm if it really is abnormal.  If it is that we will need further workup and certainly could be contributing to the headaches and lightheadedness.  Double vision-we did call her eye doctor's office and they are going to work her in tomorrow for further evaluation if they do not see anything concerning the neck step will be to get a brain MRI for further workup.  I did look at her labs done with endocrinology and it looks like the renin aldosterone  level is still pending.  EKG today shows NSR with interventricular bloack. She has a pacemaker.  She is not currently having any chest pain.   No orders of the defined types were placed in this encounter.   Return if symptoms worsen or fail to improve.  Duaine German, MD

## 2023-11-29 NOTE — Assessment & Plan Note (Signed)
 Her blood pressure looks good today and it is down from when she was at the endocrinologist office so I would suspect that if her symptoms and headache etc. referral from uncontrolled blood pressure that it would have improved greatly by today.  Her headache is better but she still having the double vision and the lightheadedness.  She denies any room spinning.

## 2023-11-30 ENCOUNTER — Ambulatory Visit: Payer: Self-pay | Admitting: Family Medicine

## 2023-11-30 DIAGNOSIS — E871 Hypo-osmolality and hyponatremia: Secondary | ICD-10-CM

## 2023-11-30 LAB — BASIC METABOLIC PANEL WITH GFR
BUN/Creatinine Ratio: 13 (ref 12–28)
BUN: 12 mg/dL (ref 8–27)
CO2: 24 mmol/L (ref 20–29)
Calcium: 9.9 mg/dL (ref 8.7–10.3)
Chloride: 95 mmol/L — ABNORMAL LOW (ref 96–106)
Creatinine, Ser: 0.95 mg/dL (ref 0.57–1.00)
Glucose: 92 mg/dL (ref 70–99)
Potassium: 4.6 mmol/L (ref 3.5–5.2)
Sodium: 131 mmol/L — ABNORMAL LOW (ref 134–144)
eGFR: 68 mL/min/{1.73_m2} (ref >59)

## 2023-11-30 NOTE — Progress Notes (Signed)
 Hi Mariah Mckinney, your sodium level is low at 131, similar to the number that you had with endocrine.  Neck step would be to get a urine and serum osmolality to check for concentration of the blood in the urine sodium levels.  To see if we can figure out if there is a disorder causing this.  I know you have an eye appointment today to you think you would be able to come by and have that done either before or after your appointment?  You can just come on if you can just make sure you have got a little bit of urine in your bladder to give her a sample.

## 2023-12-02 LAB — OSMOLALITY: Osmolality Meas: 270 mosm/kg — ABNORMAL LOW (ref 280–301)

## 2023-12-02 LAB — OSMOLALITY, URINE: Osmolality, Ur: 254 mosm/kg

## 2023-12-02 LAB — SODIUM, URINE, RANDOM: Sodium, Ur: 44 mmol/L

## 2023-12-06 NOTE — Progress Notes (Signed)
 Hi Mariah Mckinney, sorry for the delay in getting back to you.  Since the urine osmolality is on the lower side are you drinking a lot of water?  I ask because sometimes people will purposely try to go up the water and they are drinking really large amounts and it can actually dilute the sodium.  Can you let me know about how many ounces a day you have been drinking?

## 2023-12-07 NOTE — Telephone Encounter (Signed)
 Orders Placed This Encounter  Procedures   Sodium, urine, random   Osmolality, urine   Osmolality   Ambulatory referral to Endocrinology    Referral Priority:   Routine    Referral Type:   Consultation    Referral Reason:   Specialty Services Required    Number of Visits Requested:   1

## 2023-12-17 ENCOUNTER — Telehealth: Payer: Self-pay | Admitting: Family Medicine

## 2023-12-17 DIAGNOSIS — E871 Hypo-osmolality and hyponatremia: Secondary | ICD-10-CM

## 2023-12-17 DIAGNOSIS — H532 Diplopia: Secondary | ICD-10-CM

## 2023-12-17 DIAGNOSIS — R42 Dizziness and giddiness: Secondary | ICD-10-CM

## 2023-12-17 NOTE — Telephone Encounter (Signed)
 Please call patient and let them know that we had originally sent her referral for hyponatremia to endocrinology but they recommended it would best be evaluated by nephrology.  So I am going to place a new referral but that might be why she has not heard back from anybody yet so I wanted her to know that there was a delay and that she would be getting a call from nephrology

## 2023-12-20 NOTE — Telephone Encounter (Signed)
 Per the patient, she is under the care of an Endocrinologist specialist - Dr. Therisa Coppinger @ [facility]. She does not want to see a new specialist. Patient did get a call from the Nephrology specialist office and has been scheduled in late October. She continues to have symptoms on/off. Patient wants to know if it would be best to follow up with her Endocrinologist first and if needed with Nephrology. She wants to avoid going to multiple specialists, if possible. Please advise, thanks.

## 2023-12-26 NOTE — Telephone Encounter (Signed)
 The patient has been updated of the provider's recommendation. No further action is required.

## 2024-01-23 ENCOUNTER — Encounter: Payer: Self-pay | Admitting: Family Medicine

## 2024-01-23 DIAGNOSIS — I10 Essential (primary) hypertension: Secondary | ICD-10-CM

## 2024-01-23 MED ORDER — VALSARTAN 320 MG PO TABS
320.0000 mg | ORAL_TABLET | Freq: Every day | ORAL | 0 refills | Status: DC
Start: 2024-01-23 — End: 2024-04-01

## 2024-02-29 NOTE — ED Provider Notes (Signed)
 ------------------------------------------------------------------------------- Attestation signed by Junella Cy Junes, MD at 03/01/2024  6:48 PM _______________________________________________________________________ ATTENDING SUPERVISORY NOTE I have personally seen and examined the patient, and discussed the plan of care with the resident.   I have reviewed the documentation of the resident and agree.   Patient's presentation is most consistent with acute presentation with potential threat to life or bodily function.      -------------------------------------------------------------------------------  [facility] Emergency Department Physician Note   Medical Decision Making   HPI/ROS:  Mariah Mckinney is a 63 y.o. female with a medical history of Graves' disease, paroxysmal SVT, complete heart block status post pacemaker, hypertension who presents today due to concerns of a syncopal episode.  States that she was sitting down when she began to feel warm and lightheaded at rest.  Had some mild dizziness for which she thought she needs water so she got up to walk to the kitchen on the kitchen she would have return of this warmth and she would have a syncopal episode.  Her sister would be present and would slowly lowered down to the ground.  Denies any blood or head and has had resolution of her symptoms.  Denied having any chest pain or shortness of breath.  She walks every day including hills without any concerns for chest pain or shortness of breath with these minimal movements.  She is denying any dizziness at rest and has had no dizziness since being here in the emergency department.  History obtained from patient, chart review  Differential diagnosis included but not limited to: Vasovagal syncope, cardiogenic syncope, symptomatic bradycardia, electrolyte derangement  Pertinent physical exam findings to include: Cranial nerves II through XII intact, normal  coordination, 5 out of 5 strength in all extremities, normal cardiopulmonary auscultation, no C/T/L-spine tenderness to palpation, atraumatic head  Additional MDM:  On initial evaluation patient well-appearing without any signs of trauma.  Endorsing a syncopal episode but no prodromal shortness of breath or chest pain.  Did have some warmth and lightheadedness.  Physical examination without any obvious trauma, nonfocal neurological evaluation, 5 out of 5 strength no extremities, no obvious injuries to the head or spine.  Did not have any falls during this episode or blow to the head.  Laboratory assessment would reveal stable anemia but otherwise normal cell lines.  Her CMP would reveal no evidence of significant electrolyte derangement or AKI to suggest hypovolemia.  Her EKG which showed no evidence of acute ischemia and her troponin be not elevated with low concern for ACS.  Her pacemaker would be interrogated and revealed no evidence of any ventricular arrhythmias or any tachyarrhythmia or bradycardia over the preceding 2 months.  With the description of her syncopal episode is a feeling of warmth with lightheadedness resembling a vasovagal episode and no evidence of cardiac arrhythmia on EKG or pacemaker had a low concern at this time for cardiogenic syncope.  Discussed with her at this time regarding the plan for discharge and follow-up with a primary care physician.  I discussed with her the importance of increasing her p.o. intake of fluids.   Due to the patients current presenting symptoms, physical exam findings, and the workup stated above, it is thought that the etiology of the patients current presentation is:  1. Vasovagal syncope      Disposition:  DISCHARGE: Patient is felt to be medically appropriate for discharge at this time. Patient was informed of all pertinent physical exam, laboratory, and imaging findings. Patients suspected etiology of  their symptom presentation was discussed  with the patient and all questions were addressed. Patient was instructed to follow up with their primary care doctor for re-evaluation. Patient was given strict return precautions. The patient agreed with the discharge plan and verbalized understanding of return precautions. The patient was assured that we would be happy to reevaluate them at anytime if they had any concerns about their health regardless of whether it was related to this visit; and, ultimately, discharged in stable condition.   ED Disposition     ED Disposition  Discharge   Condition  Stable   Comment  --         The plan for this patient was discussed with Dr. Zelman, who voiced agreement and who oversaw evaluation and treatment of this patient.   Clinical Complexity A medically appropriate history, review of systems, and physical exam was performed.  Factors that affect the complexity of this encounter: lab work from prior visit, laboratory work from this visit, and review of echocardiogram/EKG results  Patient's presentation is most consistent with acute complicated illness / injury requiring diagnostic workup  HPI/ROS      See MDM  Medical History[1]  Surgical History[2]    Physical Exam   Vitals:   02/29/24 1352 02/29/24 1631 02/29/24 2059  BP: (!) 162/81 (!) 185/72 (!) 179/90  BP Location: Right arm Right arm Right arm  Patient Position: Sitting Sitting Sitting  Pulse: 73 61 59  Resp: 18 18 18   Temp: 98.7 F (37.1 C) 98.4 F (36.9 C) 97.9 F (36.6 C)  TempSrc: Temporal Oral Oral  SpO2: 100% 100% 100%  Weight: 70.3 kg (155 lb)    Height: 170.2 cm (5' 7)      Physical Exam Vitals and nursing note reviewed.  Constitutional:      General: She is not in acute distress.    Appearance: She is not toxic-appearing.  HENT:     Head: Atraumatic.     Mouth/Throat:     Mouth: Mucous membranes are moist.  Eyes:     Extraocular Movements: Extraocular movements intact.     Conjunctiva/sclera:  Conjunctivae normal.     Pupils: Pupils are equal, round, and reactive to light.  Cardiovascular:     Rate and Rhythm: Normal rate and regular rhythm.     Pulses: Normal pulses.     Heart sounds: Normal heart sounds.  Pulmonary:     Effort: Pulmonary effort is normal.     Breath sounds: Normal breath sounds.  Abdominal:     General: Abdomen is flat. There is no distension.     Palpations: Abdomen is soft.     Tenderness: There is no abdominal tenderness. There is no guarding.  Musculoskeletal:     Cervical back: Normal range of motion and neck supple. No tenderness.     Right lower leg: No edema.     Left lower leg: No edema.  Skin:    General: Skin is warm and dry.  Neurological:     General: No focal deficit present.     Mental Status: She is alert and oriented to person, place, and time.     Cranial Nerves: No cranial nerve deficit.     Sensory: No sensory deficit.     Motor: No weakness.     Coordination: Coordination normal.     Gait: Gait normal.      Procedures   If procedures were preformed on this patient, they are listed below:  Procedures        [  1] Past Medical History: Diagnosis Date  . Abnormal MRI, liver 12/21/2015   5.5 cm benign hemangioma in right hepatic lobe.     5.2 cm benign focal nodular hyperplasia in the left hepatic lobe.     12 mm lesion in segment 3 of the left hepatic lobe an 8 mm lesion in  segment 6 the right hepatic lobe. These lesions are difficult to  characterize due to their small size, but are likely benign.  Recommend follow-up abdomen MRI without and with Eovist  hepatocyte  specific con  . Arthritis   . Central centrifugal scarring alopecia   . Complete heart block    (CMD) 10/17/2022  . Essential hypertension 07/10/2016  . Hypertension   . MDT DC PPM for CHB 10/19/2022   On 10/19/22    . Palpitation 12/13/2011  . Paroxysmal SVT (supraventricular tachycardia) 03/05/2017  . Protein in urine 11/26/2015  . SVT (supraventricular  tachycardia) (CMD)    with ablation  . Synovitis of ankle 01/29/2014   Bilateral    . Traction alopecia 03/26/2014  . Undifferentiated connective tissue disease (CMD)   . Uterine fibroid 11/28/2015   Found on u/s 11/2015.    SABRA Ventricular tachycardia    (CMD) 10/17/2022  [2] Past Surgical History: Procedure Laterality Date  . ABLATION OF DYSRHYTHMIC FOCUS  2010   Procedure: ABLATION OF DYSRHYTHMIC FOCUS; for SVT  . CARDIAC ELECTROPHYSIOLOGY PROCEDURE N/A 10/18/2022   CV ABLATION VENTRICULAR TACHYCARDIA performed by Lavada Dominic Paterson, MD at [facility]  . CARDIAC ELECTROPHYSIOLOGY PROCEDURE N/A 10/19/2022   CV IMPLANT PACEMAKER DUAL CHAMBER performed by Lavada Dominic Paterson, MD at [facility] EP Lab

## 2024-03-01 ENCOUNTER — Other Ambulatory Visit: Payer: Self-pay | Admitting: Family Medicine

## 2024-03-01 DIAGNOSIS — I10 Essential (primary) hypertension: Secondary | ICD-10-CM

## 2024-03-05 ENCOUNTER — Encounter: Payer: Self-pay | Admitting: Family Medicine

## 2024-03-05 ENCOUNTER — Ambulatory Visit: Admitting: Family Medicine

## 2024-03-05 VITALS — BP 154/70 | HR 60 | Ht 66.0 in | Wt 154.0 lb

## 2024-03-05 DIAGNOSIS — I10 Essential (primary) hypertension: Secondary | ICD-10-CM | POA: Diagnosis not present

## 2024-03-05 DIAGNOSIS — Z23 Encounter for immunization: Secondary | ICD-10-CM

## 2024-03-05 DIAGNOSIS — R55 Syncope and collapse: Secondary | ICD-10-CM

## 2024-03-05 MED ORDER — AMLODIPINE BESYLATE 5 MG PO TABS: ORAL_TABLET | ORAL | 1 refills | Status: DC

## 2024-03-05 NOTE — Progress Notes (Signed)
 Established Patient Office Visit  Subjective  Patient ID: Mariah Mckinney, female    DOB: Dec 05, 1960  Age: 63 y.o. MRN: [id number]  Chief Complaint  Patient presents with   Follow-up    HPI Discussed the use of AI scribe software for clinical note transcription with the patient, who gave verbal consent to proceed.  History of Present Illness Mariah Mckinney is a 63 year old female who presents for a hospital follow-up after a syncopal episode.  Syncope and presyncope - Experienced a syncopal episode at home after feeling warm and lightheaded while sitting - Attempted to walk to the kitchen and lost consciousness - Did not hit her head during the fall, but may have bumped against the stove - No soreness or bruising after the event - No further episodes of syncope or lightheadedness since the initial event - Feels stronger each day and is no longer lightheaded  Blood pressure abnormalities - Blood pressure in the emergency department was elevated: initially 162/81 mmHg, later 185/72 mmHg - Blood pressure at home has been in the 150s since starting valsartan  three weeks ago - Continues to take metoprolol  - Previously on losartan /hydrochlorothiazide ; diuretic discontinued due to hyponatremia  Laboratory and diagnostic findings - CBC negative for anemia - Metabolic panel with normal kidney function and electrolytes - Slight elevation in alkaline phosphatase to 120 U/L - Normal AST and ALT levels - Normal troponin levels - Pacemaker data reviewed during hospital stay     ROS    Objective:     BP (!) 154/70   Pulse 60   Ht 5' 6 (1.676 m)   Wt 154 lb 0.6 oz (69.9 kg)   LMP 11/03/2015   SpO2 100%   BMI 24.86 kg/m    Physical Exam Vitals and nursing note reviewed.  Constitutional:      Appearance: Normal appearance.  HENT:     Head: Normocephalic and atraumatic.  Eyes:     Conjunctiva/sclera: Conjunctivae normal.  Cardiovascular:     Rate and Rhythm:  Normal rate and regular rhythm.  Pulmonary:     Effort: Pulmonary effort is normal.     Breath sounds: Normal breath sounds.  Skin:    General: Skin is warm and dry.  Neurological:     Mental Status: She is alert.  Psychiatric:        Mood and Affect: Mood normal.      No results found for any visits on 03/05/24.    The ASCVD Risk score (Arnett DK, et al., 2019) failed to calculate for the following reasons:   Risk score cannot be calculated because patient has a medical history suggesting prior/existing ASCVD    Assessment & Plan:   Problem List Items Addressed This Visit       Cardiovascular and Mediastinum   Essential hypertension - Primary   Relevant Medications   amLODipine  (NORVASC ) 5 MG tablet   Other Visit Diagnoses       Vasovagal syncope         Encounter for immunization       Relevant Orders   Flu vaccine trivalent PF, 6mos and older(Flulaval,Afluria,Fluarix,Fluzone) (Completed)      Assessment and Plan Assessment & Plan Vasovagal syncope, first episode First episode with elevated blood pressure and dehydration as possible factors. No further episodes. - Monitor for recurrence of lightheadedness or syncope. - Notify cardiologist if further episodes occur. - Consider echocardiogram if symptoms persist.  Essential hypertension Hypertension uncontrolled with valsartan  and metoprolol . Home  readings in 150s. Previous diuretic discontinued due to hyponatremia. Amlodipine  added to regimen. Discussed side effect of ankle swelling. - Add amlodipine  5 mg, start with half a tablet daily for one week, then increase to a full tablet. - Monitor blood pressure at home and report significant changes. - Watch for ankle swelling and report if it occurs. - Adjust amlodipine  dose if necessary based on blood pressure response and side effects.   Return in about 1 month (around 04/04/2024) for Nurse BP Check.    Dorothyann Byars, MD

## 2024-03-05 NOTE — Patient Instructions (Signed)
 Let me know what your BPs is doing in about 2 weeks with new medicine.

## 2024-03-25 ENCOUNTER — Ambulatory Visit: Admitting: Family Medicine

## 2024-03-31 ENCOUNTER — Encounter: Payer: Self-pay | Admitting: Family Medicine

## 2024-04-01 ENCOUNTER — Ambulatory Visit (INDEPENDENT_AMBULATORY_CARE_PROVIDER_SITE_OTHER)

## 2024-04-01 DIAGNOSIS — I10 Essential (primary) hypertension: Secondary | ICD-10-CM | POA: Diagnosis not present

## 2024-04-01 MED ORDER — AMLODIPINE BESYLATE 10 MG PO TABS: 10.0000 mg | ORAL_TABLET | Freq: Every day | ORAL | 3 refills | Status: DC

## 2024-04-01 MED ORDER — VALSARTAN 320 MG PO TABS: 320.0000 mg | ORAL_TABLET | Freq: Every day | ORAL | 0 refills | Status: AC

## 2024-04-01 NOTE — Progress Notes (Signed)
 Patient is here for a 1 month BP check. Last OV it was 154/70. Amlodipine  5 mg added to regimen. Pt denies CP, headaches, medication changes, heart palpitations.   Patients first BP was 146/69. Pt sat for 10 mins and then it was retaken. Second reading was 142/73 reported to Dr. Alvan who would like to increase her amlodipine  5mg  to 10 mg and follow up nurse visit 2-3 wks. Patient is also requesting a refill on her valsartan  in which I will go ahead and send in for her.

## 2024-04-01 NOTE — Addendum Note (Signed)
 Addended by: Arvil Utz D on: 04/01/2024 01:29 PM   Modules accepted: Orders

## 2024-04-16 ENCOUNTER — Ambulatory Visit

## 2024-06-17 ENCOUNTER — Ambulatory Visit: Payer: Self-pay

## 2024-06-17 NOTE — Telephone Encounter (Signed)
 FYI Only or Action Required?: FYI only for provider: appointment scheduled on 06/19/24.  Patient was last seen in primary care on 03/05/2024 by Mariah Dorothyann BIRCH, MD.  Called Nurse Triage reporting Joint Swelling.  Symptoms began about a month ago.  Interventions attempted: Other: elevating feet.  Symptoms are: gradually worsening.  Triage Disposition: See PCP When Office is Open (Within 3 Days) (overriding See HCP Within 4 Hours (Or PCP Triage))  Patient/caregiver understands and will follow disposition?: Yes              Copied from CRM [id number]. Topic: Clinical - Red Word Triage >> Jun 17, 2024  2:42 PM Winona R wrote: Swollen ankles with taking BP medication amLODipine  (NORVASC ) 10 MG tablet Reason for Disposition  [1] Thigh, calf, or ankle swelling AND [2] bilateral AND [3] 1 side is more swollen  Answer Assessment - Initial Assessment Questions 1. LOCATION: Which ankle is swollen? Where is the swelling?     Bilateral ankles; whole joint. Left ankle swells more than the right. Comes and goes, improves with elevating feet.  2. ONSET: When did the swelling start?     At least a month, several weeks.  3. SWELLING: How bad is the swelling? Or, How large is it? (e.g., mild, moderate, severe; size of localized swelling)      3/5 in severity.  4. PAIN: Is there any pain? If Yes, ask: How bad is it? (Scale 0-10; or none, mild, moderate, severe)     A little uncomfortable but not painful.  5. CAUSE: What do you think caused the ankle swelling?     Several months started amlodipine , started on 5mg  and then increased to 10mg  for about 1.5 months  6. OTHER SYMPTOMS: Do you have any other symptoms? (e.g., fever, chest pain, difficulty breathing, calf pain)     No chest pain, SOB, discoloration in feet or ankles, fever  Protocols used: Ankle Swelling-A-AH

## 2024-06-19 ENCOUNTER — Ambulatory Visit: Admitting: Family Medicine

## 2024-06-19 VITALS — BP 114/57 | HR 64 | Ht 66.0 in | Wt 158.9 lb

## 2024-06-19 DIAGNOSIS — Z23 Encounter for immunization: Secondary | ICD-10-CM | POA: Diagnosis not present

## 2024-06-19 DIAGNOSIS — I10 Essential (primary) hypertension: Secondary | ICD-10-CM

## 2024-06-19 MED ORDER — AMLODIPINE BESYLATE 5 MG PO TABS: 7.5000 mg | ORAL_TABLET | Freq: Every day | ORAL | Status: DC

## 2024-06-19 NOTE — Progress Notes (Signed)
 "  Acute Office Visit  Patient ID: Mariah Mckinney, female    DOB: May 17, 1961, 64 y.o.   MRN: [id number]  PCP: Alvan Dorothyann BIRCH, MD  Chief Complaint  Patient presents with   Joint Swelling    Subjective:     HPI  Discussed the use of AI scribe software for clinical note transcription with the patient, who gave verbal consent to proceed.  History of Present Illness Mariah Mckinney is a 65 year old female with hypertension who presents with lower extremity swelling after an increase in amlodipine  dosage.  Lower extremity edema - Swelling in lower extremities, more pronounced on the left side - Right lower extremity also appears puffier than usual - Edema worsens by the end of the day and extends up to the lower legs - Difficulty wearing shoes due to swollen ankles - Tingling and pressure in swollen ankles  Antihypertensive medication effects - Amlodipine  dosage increased from 7.55 mg to 10 mg, after which swelling developed - Swelling improved after reducing amlodipine  back to 5 mg, but blood pressure increased to 130-133 mmHg - Blood pressure generally below 125 mmHg, sometimes as low as 117-118 mmHg on 10 mg amlodipine  - Previously took hydrochlorothiazide , which caused hyponatremia - Currently taking valsartan  and metoprolol    ROS     Objective:    BP (!) 114/57   Pulse 64   Ht 5' 6 (1.676 m)   Wt 158 lb 14.4 oz (72.1 kg)   LMP 11/03/2015   SpO2 100%   BMI 25.65 kg/m    Physical Exam Vitals and nursing note reviewed.  Constitutional:      Appearance: Normal appearance.  HENT:     Head: Normocephalic and atraumatic.  Eyes:     Conjunctiva/sclera: Conjunctivae normal.  Cardiovascular:     Rate and Rhythm: Normal rate and regular rhythm.  Pulmonary:     Effort: Pulmonary effort is normal.     Breath sounds: Normal breath sounds.  Skin:    General: Skin is warm and dry.  Neurological:     Mental Status: She is alert.  Psychiatric:        Mood  and Affect: Mood normal.       No results found for any visits on 06/19/24.     Assessment & Plan:   Problem List Items Addressed This Visit       Cardiovascular and Mediastinum   Essential hypertension   Relevant Medications   amLODipine  (NORVASC ) 5 MG tablet   Other Visit Diagnoses       Encounter for immunization    -  Primary   Relevant Orders   Pneumococcal conjugate vaccine 20-valent (Completed)       Assessment and Plan Assessment & Plan Essential hypertension with drug-induced peripheral edema Hypertension managed with amlodipine , valsartan , and metoprolol . Increased amlodipine  to 10 mg improved blood pressure but caused edema. Reduced to 5 mg improved edema but increased blood pressure. Current optimal blood pressure at 10 mg. Considering 7.5 mg to balance control and edema. Discussed spironolactone addition if edema persists, with potassium monitoring. - Adjust amlodipine  to 7.5 mg daily by taking one and a half 5 mg tablets. - Monitor blood pressure at home, aiming for readings in the high 120s to low 130s. - If edema persists on 7.5 mg amlodipine , consider adding spironolactone and monitor potassium levels. - Consult cardiology if further adjustments are needed.  General Health Maintenance Pneumonia vaccination is due. - Administered Prevnar (pneumonia vaccine) today.  Meds ordered this encounter  Medications   amLODipine  (NORVASC ) 5 MG tablet    Sig: Take 1.5 tablets (7.5 mg total) by mouth daily.    Return in about 6 months (around 12/17/2024) for Hypertension.  Dorothyann Byars, MD [facility] at [facility]   "

## 2024-06-19 NOTE — Progress Notes (Signed)
 Pt reports that when her Amlodipine  was increased to 10 mg she noticed that her ankles and feet began to swell.   She stated that she did go back down to taking the 5 mg for about 2 weeks and that did help some so she went back to taking the 10 mg

## 2024-06-20 ENCOUNTER — Encounter: Payer: Self-pay | Admitting: Family Medicine

## 2024-06-20 DIAGNOSIS — I10 Essential (primary) hypertension: Secondary | ICD-10-CM

## 2024-06-20 MED ORDER — AMLODIPINE BESYLATE 5 MG PO TABS: 7.5000 mg | ORAL_TABLET | Freq: Every day | ORAL | 1 refills | Status: DC

## 2024-06-25 ENCOUNTER — Other Ambulatory Visit: Payer: Self-pay | Admitting: *Deleted

## 2024-06-25 DIAGNOSIS — I10 Essential (primary) hypertension: Secondary | ICD-10-CM

## 2024-06-25 MED ORDER — AMLODIPINE BESYLATE 5 MG PO TABS: 7.5000 mg | ORAL_TABLET | Freq: Every day | ORAL | 1 refills | Status: AC

## 2024-07-18 ENCOUNTER — Other Ambulatory Visit: Payer: Self-pay | Admitting: Family Medicine

## 2024-07-18 DIAGNOSIS — Z1231 Encounter for screening mammogram for malignant neoplasm of breast: Secondary | ICD-10-CM

## 2024-08-01 ENCOUNTER — Ambulatory Visit

## 2024-12-17 ENCOUNTER — Ambulatory Visit: Admitting: Family Medicine
